# Patient Record
Sex: Female | Born: 1995
Health system: Southern US, Community
[De-identification: ages and names within clinical notes are randomized; demographics above are authoritative.]

## PROBLEM LIST (undated history)

## (undated) ENCOUNTER — Emergency Department (HOSPITAL_COMMUNITY): Admission: EM | Payer: Self-pay | Source: Home / Self Care

## (undated) DIAGNOSIS — H9325 Central auditory processing disorder: Secondary | ICD-10-CM

## (undated) DIAGNOSIS — R569 Unspecified convulsions: Secondary | ICD-10-CM

## (undated) DIAGNOSIS — F32A Depression, unspecified: Secondary | ICD-10-CM

## (undated) HISTORY — PX: TYMPANOSTOMY TUBE PLACEMENT: SHX32

---
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
## 2014-04-18 ENCOUNTER — Other Ambulatory Visit: Payer: Self-pay | Admitting: Family

## 2014-05-08 ENCOUNTER — Ambulatory Visit: Payer: Self-pay | Admitting: Physician Assistant

## 2014-05-09 ENCOUNTER — Other Ambulatory Visit: Payer: Self-pay | Admitting: Family

## 2014-06-05 ENCOUNTER — Other Ambulatory Visit: Payer: Self-pay | Admitting: Family

## 2014-06-13 ENCOUNTER — Ambulatory Visit: Payer: PRIVATE HEALTH INSURANCE | Admitting: Pediatrics

## 2014-06-20 ENCOUNTER — Other Ambulatory Visit: Payer: Self-pay | Admitting: Family

## 2014-06-27 ENCOUNTER — Ambulatory Visit: Payer: Self-pay | Admitting: Physician Assistant

## 2014-06-27 ENCOUNTER — Ambulatory Visit (INDEPENDENT_AMBULATORY_CARE_PROVIDER_SITE_OTHER): Payer: Commercial Managed Care - PPO | Admitting: Physician Assistant

## 2014-06-27 ENCOUNTER — Encounter: Payer: Self-pay | Admitting: Physician Assistant

## 2014-06-27 VITALS — BP 120/70 | HR 80 | Temp 98.1°F | Resp 16 | Ht 67.0 in | Wt 155.0 lb

## 2014-06-27 DIAGNOSIS — Z79899 Other long term (current) drug therapy: Secondary | ICD-10-CM

## 2014-06-27 DIAGNOSIS — Z23 Encounter for immunization: Secondary | ICD-10-CM

## 2014-06-27 DIAGNOSIS — R569 Unspecified convulsions: Secondary | ICD-10-CM

## 2014-06-27 DIAGNOSIS — R5383 Other fatigue: Secondary | ICD-10-CM

## 2014-06-27 DIAGNOSIS — Z8349 Family history of other endocrine, nutritional and metabolic diseases: Secondary | ICD-10-CM

## 2014-06-27 DIAGNOSIS — E559 Vitamin D deficiency, unspecified: Secondary | ICD-10-CM

## 2014-06-27 DIAGNOSIS — Z83438 Family history of other disorder of lipoprotein metabolism and other lipidemia: Secondary | ICD-10-CM

## 2014-06-27 LAB — CBC WITH DIFFERENTIAL/PLATELET
Basophils Absolute: 0 10*3/uL (ref 0.0–0.1)
Basophils Relative: 1 % (ref 0–1)
Eosinophils Absolute: 0 10*3/uL (ref 0.0–0.7)
Eosinophils Relative: 1 % (ref 0–5)
HEMATOCRIT: 37.7 % (ref 36.0–46.0)
HEMOGLOBIN: 13 g/dL (ref 12.0–15.0)
LYMPHS ABS: 1.6 10*3/uL (ref 0.7–4.0)
Lymphocytes Relative: 45 % (ref 12–46)
MCH: 29.5 pg (ref 26.0–34.0)
MCHC: 34.5 g/dL (ref 30.0–36.0)
MCV: 85.7 fL (ref 78.0–100.0)
MONOS PCT: 7 % (ref 3–12)
MPV: 9.5 fL (ref 8.6–12.4)
Monocytes Absolute: 0.2 10*3/uL (ref 0.1–1.0)
NEUTROS ABS: 1.6 10*3/uL — AB (ref 1.7–7.7)
NEUTROS PCT: 46 % (ref 43–77)
Platelets: 169 10*3/uL (ref 150–400)
RBC: 4.4 MIL/uL (ref 3.87–5.11)
RDW: 13.7 % (ref 11.5–15.5)
WBC: 3.5 10*3/uL — AB (ref 4.0–10.5)

## 2014-06-27 NOTE — Progress Notes (Signed)
Complete Physical  Assessment and Plan:  1. Seizures Continue follow up with neuro  2. Vitamin D deficiency - Vit D  25 hydroxy (rtn osteoporosis monitoring)  3. Medication management - CBC with Differential/Platelet - BASIC METABOLIC PANEL WITH GFR - Hepatic function panel - Magnesium  4. Other fatigue - TSH - Iron and TIBC - Ferritin - Vitamin B12  5. Family history of hyperlipidemia - Lipid panel  6. Need for vaccination - HPV 9-valent vaccine,Recombinat Need 2 month and 6 month  7. Atypical nevus Schedule removal  8. Health Maintenance- Discussed STD testing, safe sex, alcohol and drug awareness, drinking and driving dangers, wearing a seat belt and general safety measures for young adult.  Discussed med's effects and SE's. Screening labs and tests as requested with regular follow-up as recommended. Over 40 minutes of exam, counseling, chart review and critical decision making was performed  HPI  This very nice 19 y.o.female presents for complete physical.  Patient has no major health issues.  Patient reports no complaints at this time.  She does not workout. Her mother states she does not eat well, not enough veggies.  Finally, patient has history of Vitamin D Deficiency, need vitamin D checked.  She has history of seizures since she was 25 months old, follows with ped neuro, Dr. Sharene Skeans, she is now on depakote ER, last seizure 8 months ago.  She has some fatigue, does not want to go out and do things. She does not have a job at this time and when asked states " mom will help her find one" denies any interest. Does like dance, can not drive due to seizures, wants to when she is seizure free 1 year   Current Medications:  Current Outpatient Prescriptions on File Prior to Visit  Medication Sig Dispense Refill  . divalproex (DEPAKOTE ER) 500 MG 24 hr tablet TAKE 2 TABLETS BY MOUTH AT BEDTIME 60 tablet 0  . felbamate (FELBATOL) 600 MG tablet TAKE 2+1/2 TABLETS IN THE  MORNING AND 2+1/2 TABLETS AT BEDTIME 155 tablet 0  . midazolam (VERSED) 5 MG/ML injection Place 2 mLs (10 mg total) into the nose once. Draw up 1ml in 2 syringes, remove blue vial access device, then attach syringe to nasal atomizer for intranasal administration. Give 1ml in each nostril for seizures lasting 2 minutes or longer. 4 mL 0   No current facility-administered medications on file prior to visit.   Health Maintenance:   LMP: has nexplanon, has irreg perriods.  Sexually Active: no STD testing offered Pap: due 2016 MGM: N/A Last Dental Exam: recent to dentist  Last Eye Exam: unknown name, no glasses/contacts  Allergies:  Allergies  Allergen Reactions  . Dust Mite Extract    Medical History:  Past Medical History  Diagnosis Date  . Seizures    Surgical History: No past surgical history on file. Family History:  Family History  Problem Relation Age of Onset  . Lung cancer Maternal Grandfather     Died at 7  . Cancer Paternal Grandmother     Died at 69   Social History:  History  Substance Use Topics  . Smoking status: Passive Smoke Exposure - Never Smoker  . Smokeless tobacco: Never Used  . Alcohol Use: No    Review of Systems: Review of Systems  Constitutional: Positive for malaise/fatigue. Negative for fever, chills, weight loss and diaphoresis.  HENT: Negative.   Eyes: Negative.   Respiratory: Negative.   Cardiovascular: Negative.   Gastrointestinal: Negative.  Genitourinary: Negative.   Musculoskeletal: Negative.   Skin: Negative.   Neurological: Negative.  Negative for weakness.  Endo/Heme/Allergies: Negative.   Psychiatric/Behavioral: Negative.     Physical Exam: Estimated body mass index is 24.27 kg/(m^2) as calculated from the following:   Height as of this encounter: 5\' 7"  (1.702 m).   Weight as of this encounter: 155 lb (70.308 kg). BP 120/70 mmHg  Pulse 80  Temp(Src) 98.1 F (36.7 C)  Resp 16  Ht 5\' 7"  (1.702 m)  Wt 155 lb (70.308  kg)  BMI 24.27 kg/m2 General Appearance: Well nourished, in no apparent distress.  Eyes: PERRLA, EOMs, conjunctiva no swelling or erythema, normal fundi and vessels.  Sinuses: No Frontal/maxillary tenderness  ENT/Mouth: Ext aud canals clear, normal light reflex with TMs without erythema, bulging. Good dentition. No erythema, swelling, or exudate on post pharynx. Tonsils not swollen or erythematous. Hearing normal.  Neck: Supple, thyroid normal. No bruits  Respiratory: Respiratory effort normal, BS equal bilaterally without rales, rhonchi, wheezing or stridor.  Cardio: RRR without murmurs, rubs or gallops. Brisk peripheral pulses without edema.  Chest: symmetric, with normal excursions and percussion.  Breasts: Symmetric, without lumps, nipple discharge, retractions.  Abdomen: Soft, nontender, no guarding, rebound, hernias, masses, or organomegaly.  Lymphatics: Non tender without lymphadenopathy.  Genitourinary:  Musculoskeletal: Full ROM all peripheral extremities,5/5 strength, and normal gait.  Skin: Warm, dry without rashes, lesions, ecchymosis. Left lower 1x1 dark irreg border nevus, right upper back 3.5x4 irreg color nevus.  Neuro: Cranial nerves intact, reflexes equal bilaterally. Normal muscle tone, no cerebellar symptoms. Sensation intact.  Psych: Awake and oriented X 3, normal affect, Insight and Judgment appropriate.   EKG: defer  Quentin MullingCollier, Lashaun Krapf 10:58 AM [facility]

## 2014-06-27 NOTE — Patient Instructions (Signed)
Preventive Care for Adults A healthy lifestyle and preventive care can promote health and wellness. Preventive health guidelines for women include the following key practices.  A routine yearly physical is a good way to check with your health care provider about your health and preventive screening. It is a chance to share any concerns and updates on your health and to receive a thorough exam.  Visit your dentist for a routine exam and preventive care every 6 months. Brush your teeth twice a day and floss once a day. Good oral hygiene prevents tooth decay and gum disease.  The frequency of eye exams is based on your age, health, family medical history, use of contact lenses, and other factors. Follow your health care provider's recommendations for frequency of eye exams.  Eat a healthy diet. Foods like vegetables, fruits, whole grains, low-fat dairy products, and lean protein foods contain the nutrients you need without too many calories. Decrease your intake of foods high in solid fats, added sugars, and salt. Eat the right amount of calories for you.Get information about a proper diet from your health care provider, if necessary.  Regular physical exercise is one of the most important things you can do for your health. Most adults should get at least 150 minutes of moderate-intensity exercise (any activity that increases your heart rate and causes you to sweat) each week. In addition, most adults need muscle-strengthening exercises on 2 or more days a week.  Maintain a healthy weight. The body mass index (BMI) is a screening tool to identify possible weight problems. It provides an estimate of body fat based on height and weight. Your health care provider can find your BMI and can help you achieve or maintain a healthy weight.For adults 20 years and older:  A BMI below 18.5 is considered underweight.  A BMI of 18.5 to 24.9 is normal.  A BMI of 25 to 29.9 is considered overweight.  A BMI of  30 and above is considered obese.  Maintain normal blood lipids and cholesterol levels by exercising and minimizing your intake of saturated fat. Eat a balanced diet with plenty of fruit and vegetables. Blood tests for lipids and cholesterol should begin at age 76 and be repeated every 5 years. If your lipid or cholesterol levels are high, you are over 50, or you are at high risk for heart disease, you may need your cholesterol levels checked more frequently.Ongoing high lipid and cholesterol levels should be treated with medicines if diet and exercise are not working.  If you smoke, find out from your health care provider how to quit. If you do not use tobacco, do not start.  Lung cancer screening is recommended for adults aged 22-80 years who are at high risk for developing lung cancer because of a history of smoking. A yearly low-dose CT scan of the lungs is recommended for people who have at least a 30-pack-year history of smoking and are a current smoker or have quit within the past 15 years. A pack year of smoking is smoking an average of 1 pack of cigarettes a day for 1 year (for example: 1 pack a day for 30 years or 2 packs a day for 15 years). Yearly screening should continue until the smoker has stopped smoking for at least 15 years. Yearly screening should be stopped for people who develop a health problem that would prevent them from having lung cancer treatment.  If you are pregnant, do not drink alcohol. If you are breastfeeding,  be very cautious about drinking alcohol. If you are not pregnant and choose to drink alcohol, do not have more than 1 drink per day. One drink is considered to be 12 ounces (355 mL) of beer, 5 ounces (148 mL) of wine, or 1.5 ounces (44 mL) of liquor.  Avoid use of street drugs. Do not share needles with anyone. Ask for help if you need support or instructions about stopping the use of drugs.  High blood pressure causes heart disease and increases the risk of  stroke. Your blood pressure should be checked at least every 1 to 2 years. Ongoing high blood pressure should be treated with medicines if weight loss and exercise do not work.  If you are 75-52 years old, ask your health care provider if you should take aspirin to prevent strokes.  Diabetes screening involves taking a blood sample to check your fasting blood sugar level. This should be done once every 3 years, after age 15, if you are within normal weight and without risk factors for diabetes. Testing should be considered at a younger age or be carried out more frequently if you are overweight and have at least 1 risk factor for diabetes.  Breast cancer screening is essential preventive care for women. You should practice "breast self-awareness." This means understanding the normal appearance and feel of your breasts and may include breast self-examination. Any changes detected, no matter how small, should be reported to a health care provider. Women in their 58s and 30s should have a clinical breast exam (CBE) by a health care provider as part of a regular health exam every 1 to 3 years. After age 16, women should have a CBE every year. Starting at age 53, women should consider having a mammogram (breast X-ray test) every year. Women who have a family history of breast cancer should talk to their health care provider about genetic screening. Women at a high risk of breast cancer should talk to their health care providers about having an MRI and a mammogram every year.  Breast cancer gene (BRCA)-related cancer risk assessment is recommended for women who have family members with BRCA-related cancers. BRCA-related cancers include breast, ovarian, tubal, and peritoneal cancers. Having family members with these cancers may be associated with an increased risk for harmful changes (mutations) in the breast cancer genes BRCA1 and BRCA2. Results of the assessment will determine the need for genetic counseling and  BRCA1 and BRCA2 testing.  Routine pelvic exams to screen for cancer are no longer recommended for nonpregnant women who are considered low risk for cancer of the pelvic organs (ovaries, uterus, and vagina) and who do not have symptoms. Ask your health care provider if a screening pelvic exam is right for you.  If you have had past treatment for cervical cancer or a condition that could lead to cancer, you need Pap tests and screening for cancer for at least 20 years after your treatment. If Pap tests have been discontinued, your risk factors (such as having a new sexual partner) need to be reassessed to determine if screening should be resumed. Some women have medical problems that increase the chance of getting cervical cancer. In these cases, your health care provider may recommend more frequent screening and Pap tests.  The HPV test is an additional test that may be used for cervical cancer screening. The HPV test looks for the virus that can cause the cell changes on the cervix. The cells collected during the Pap test can be  tested for HPV. The HPV test could be used to screen women aged 30 years and older, and should be used in women of any age who have unclear Pap test results. After the age of 30, women should have HPV testing at the same frequency as a Pap test.  Colorectal cancer can be detected and often prevented. Most routine colorectal cancer screening begins at the age of 50 years and continues through age 75 years. However, your health care provider may recommend screening at an earlier age if you have risk factors for colon cancer. On a yearly basis, your health care provider may provide home test kits to check for hidden blood in the stool. Use of a small camera at the end of a tube, to directly examine the colon (sigmoidoscopy or colonoscopy), can detect the earliest forms of colorectal cancer. Talk to your health care provider about this at age 50, when routine screening begins. Direct  exam of the colon should be repeated every 5-10 years through age 75 years, unless early forms of pre-cancerous polyps or small growths are found.  People who are at an increased risk for hepatitis B should be screened for this virus. You are considered at high risk for hepatitis B if:  You were born in a country where hepatitis B occurs often. Talk with your health care provider about which countries are considered high risk.  Your parents were born in a high-risk country and you have not received a shot to protect against hepatitis B (hepatitis B vaccine).  You have HIV or AIDS.  You use needles to inject street drugs.  You live with, or have sex with, someone who has hepatitis B.  You get hemodialysis treatment.  You take certain medicines for conditions like cancer, organ transplantation, and autoimmune conditions.  Hepatitis C blood testing is recommended for all people born from 1945 through 1965 and any individual with known risks for hepatitis C.  Practice safe sex. Use condoms and avoid high-risk sexual practices to reduce the spread of sexually transmitted infections (STIs). STIs include gonorrhea, chlamydia, syphilis, trichomonas, herpes, HPV, and human immunodeficiency virus (HIV). Herpes, HIV, and HPV are viral illnesses that have no cure. They can result in disability, cancer, and death.  You should be screened for sexually transmitted illnesses (STIs) including gonorrhea and chlamydia if:  You are sexually active and are younger than 24 years.  You are older than 24 years and your health care provider tells you that you are at risk for this type of infection.  Your sexual activity has changed since you were last screened and you are at an increased risk for chlamydia or gonorrhea. Ask your health care provider if you are at risk.  If you are at risk of being infected with HIV, it is recommended that you take a prescription medicine daily to prevent HIV infection. This is  called preexposure prophylaxis (PrEP). You are considered at risk if:  You are a heterosexual woman, are sexually active, and are at increased risk for HIV infection.  You take drugs by injection.  You are sexually active with a partner who has HIV.  Talk with your health care provider about whether you are at high risk of being infected with HIV. If you choose to begin PrEP, you should first be tested for HIV. You should then be tested every 3 months for as long as you are taking PrEP.  Osteoporosis is a disease in which the bones lose minerals and strength   with aging. This can result in serious bone fractures or breaks. The risk of osteoporosis can be identified using a bone density scan. Women ages 65 years and over and women at risk for fractures or osteoporosis should discuss screening with their health care providers. Ask your health care provider whether you should take a calcium supplement or vitamin D to reduce the rate of osteoporosis.  Menopause can be associated with physical symptoms and risks. Hormone replacement therapy is available to decrease symptoms and risks. You should talk to your health care provider about whether hormone replacement therapy is right for you.  Use sunscreen. Apply sunscreen liberally and repeatedly throughout the day. You should seek shade when your shadow is shorter than you. Protect yourself by wearing long sleeves, pants, a wide-brimmed hat, and sunglasses year round, whenever you are outdoors.  Once a month, do a whole body skin exam, using a mirror to look at the skin on your back. Tell your health care provider of new moles, moles that have irregular borders, moles that are larger than a pencil eraser, or moles that have changed in shape or color.  Stay current with required vaccines (immunizations).  Influenza vaccine. All adults should be immunized every year.  Tetanus, diphtheria, and acellular pertussis (Td, Tdap) vaccine. Pregnant women should  receive 1 dose of Tdap vaccine during each pregnancy. The dose should be obtained regardless of the length of time since the last dose. Immunization is preferred during the 27th-36th week of gestation. An adult who has not previously received Tdap or who does not know her vaccine status should receive 1 dose of Tdap. This initial dose should be followed by tetanus and diphtheria toxoids (Td) booster doses every 10 years. Adults with an unknown or incomplete history of completing a 3-dose immunization series with Td-containing vaccines should begin or complete a primary immunization series including a Tdap dose. Adults should receive a Td booster every 10 years.  Varicella vaccine. An adult without evidence of immunity to varicella should receive 2 doses or a second dose if she has previously received 1 dose. Pregnant females who do not have evidence of immunity should receive the first dose after pregnancy. This first dose should be obtained before leaving the health care facility. The second dose should be obtained 4-8 weeks after the first dose.  Human papillomavirus (HPV) vaccine. Females aged 13-26 years who have not received the vaccine previously should obtain the 3-dose series. The vaccine is not recommended for use in pregnant females. However, pregnancy testing is not needed before receiving a dose. If a female is found to be pregnant after receiving a dose, no treatment is needed. In that case, the remaining doses should be delayed until after the pregnancy. Immunization is recommended for any person with an immunocompromised condition through the age of 26 years if she did not get any or all doses earlier. During the 3-dose series, the second dose should be obtained 4-8 weeks after the first dose. The third dose should be obtained 24 weeks after the first dose and 16 weeks after the second dose.  Zoster vaccine. One dose is recommended for adults aged 60 years or older unless certain conditions are  present.  Measles, mumps, and rubella (MMR) vaccine. Adults born before 1957 generally are considered immune to measles and mumps. Adults born in 1957 or later should have 1 or more doses of MMR vaccine unless there is a contraindication to the vaccine or there is laboratory evidence of immunity to   each of the three diseases. A routine second dose of MMR vaccine should be obtained at least 28 days after the first dose for students attending postsecondary schools, health care workers, or international travelers. People who received inactivated measles vaccine or an unknown type of measles vaccine during 1963-1967 should receive 2 doses of MMR vaccine. People who received inactivated mumps vaccine or an unknown type of mumps vaccine before 1979 and are at high risk for mumps infection should consider immunization with 2 doses of MMR vaccine. For females of childbearing age, rubella immunity should be determined. If there is no evidence of immunity, females who are not pregnant should be vaccinated. If there is no evidence of immunity, females who are pregnant should delay immunization until after pregnancy. Unvaccinated health care workers born before 1957 who lack laboratory evidence of measles, mumps, or rubella immunity or laboratory confirmation of disease should consider measles and mumps immunization with 2 doses of MMR vaccine or rubella immunization with 1 dose of MMR vaccine.  Pneumococcal 13-valent conjugate (PCV13) vaccine. When indicated, a person who is uncertain of her immunization history and has no record of immunization should receive the PCV13 vaccine. An adult aged 19 years or older who has certain medical conditions and has not been previously immunized should receive 1 dose of PCV13 vaccine. This PCV13 should be followed with a dose of pneumococcal polysaccharide (PPSV23) vaccine. The PPSV23 vaccine dose should be obtained at least 8 weeks after the dose of PCV13 vaccine. An adult aged 19  years or older who has certain medical conditions and previously received 1 or more doses of PPSV23 vaccine should receive 1 dose of PCV13. The PCV13 vaccine dose should be obtained 1 or more years after the last PPSV23 vaccine dose.  Pneumococcal polysaccharide (PPSV23) vaccine. When PCV13 is also indicated, PCV13 should be obtained first. All adults aged 65 years and older should be immunized. An adult younger than age 65 years who has certain medical conditions should be immunized. Any person who resides in a nursing home or long-term care facility should be immunized. An adult smoker should be immunized. People with an immunocompromised condition and certain other conditions should receive both PCV13 and PPSV23 vaccines. People with human immunodeficiency virus (HIV) infection should be immunized as soon as possible after diagnosis. Immunization during chemotherapy or radiation therapy should be avoided. Routine use of PPSV23 vaccine is not recommended for American Indians, [place] Natives, or people younger than 65 years unless there are medical conditions that require PPSV23 vaccine. When indicated, people who have unknown immunization and have no record of immunization should receive PPSV23 vaccine. One-time revaccination 5 years after the first dose of PPSV23 is recommended for people aged 19-64 years who have chronic kidney failure, nephrotic syndrome, asplenia, or immunocompromised conditions. People who received 1-2 doses of PPSV23 before age 65 years should receive another dose of PPSV23 vaccine at age 65 years or later if at least 5 years have passed since the previous dose. Doses of PPSV23 are not needed for people immunized with PPSV23 at or after age 65 years.  Meningococcal vaccine. Adults with asplenia or persistent complement component deficiencies should receive 2 doses of quadrivalent meningococcal conjugate (MenACWY-D) vaccine. The doses should be obtained at least 2 months apart.  Microbiologists working with certain meningococcal bacteria, military recruits, people at risk during an outbreak, and people who travel to or live in countries with a high rate of meningitis should be immunized. A first-year college student up through age   21 years who is living in a residence hall should receive a dose if she did not receive a dose on or after her 16th birthday. Adults who have certain high-risk conditions should receive one or more doses of vaccine.  Hepatitis A vaccine. Adults who wish to be protected from this disease, have certain high-risk conditions, work with hepatitis A-infected animals, work in hepatitis A research labs, or travel to or work in countries with a high rate of hepatitis A should be immunized. Adults who were previously unvaccinated and who anticipate close contact with an international adoptee during the first 60 days after arrival in the [place] from a country with a high rate of hepatitis A should be immunized.  Hepatitis B vaccine. Adults who wish to be protected from this disease, have certain high-risk conditions, may be exposed to blood or other infectious body fluids, are household contacts or sex partners of hepatitis B positive people, are clients or workers in certain care facilities, or travel to or work in countries with a high rate of hepatitis B should be immunized.  Haemophilus influenzae type b (Hib) vaccine. A previously unvaccinated person with asplenia or sickle cell disease or having a scheduled splenectomy should receive 1 dose of Hib vaccine. Regardless of previous immunization, a recipient of a hematopoietic stem cell transplant should receive a 3-dose series 6-12 months after her successful transplant. Hib vaccine is not recommended for adults with HIV infection. Preventive Services / Frequency  Ages 19 to 39 years 1. Blood pressure check. 2. Lipid and cholesterol check. 3. Clinical breast exam.** / Every 3 years for women in their  20s and 30s. 4. BRCA-related cancer risk assessment.** / For women who have family members with a BRCA-related cancer (breast, ovarian, tubal, or peritoneal cancers). 5. Pap test.** / Every 2 years from ages 21 through 29. Every 3 years starting at age 30 through age 65 or 70 with a history of 3 consecutive normal Pap tests. 6. HPV screening.** / Every 3 years from ages 30 through ages 65 to 70 with a history of 3 consecutive normal Pap tests. 7. Hepatitis C blood test.** / For any individual with known risks for hepatitis C. 8. Skin self-exam. / Monthly. 9. Influenza vaccine. / Every year. 10. Tetanus, diphtheria, and acellular pertussis (Tdap, Td) vaccine.** / Consult your health care provider. Pregnant women should receive 1 dose of Tdap vaccine during each pregnancy. 1 dose of Td every 10 years. 11. Varicella vaccine.** / Consult your health care provider. Pregnant females who do not have evidence of immunity should receive the first dose after pregnancy. 12. HPV vaccine. / 3 doses over 6 months, if 26 and younger. The vaccine is not recommended for use in pregnant females. However, pregnancy testing is not needed before receiving a dose. 13. Measles, mumps, rubella (MMR) vaccine.** / You need at least 1 dose of MMR if you were born in 1957 or later. You may also need a 2nd dose. For females of childbearing age, rubella immunity should be determined. If there is no evidence of immunity, females who are not pregnant should be vaccinated. If there is no evidence of immunity, females who are pregnant should delay immunization until after pregnancy. 14. Pneumococcal 13-valent conjugate (PCV13) vaccine.** / Consult your health care provider. 15. Pneumococcal polysaccharide (PPSV23) vaccine.** / 1 to 2 doses if you smoke cigarettes or if you have certain conditions. 16. Meningococcal vaccine.** / 1 dose if you are age 19 to 21 years and a   first-year college student living in a residence hall, or have one  of several medical conditions, you need to get vaccinated against meningococcal disease. You may also need additional booster doses. 17. Hepatitis A vaccine.** / Consult your health care provider. 18. Hepatitis B vaccine.** / Consult your health care provider. 19. Haemophilus influenzae type b (Hib) vaccine.** / Consult your health care provider.  24. Chlamydia, HIV, and other sexually transmitted diseases- Get screened every year until age 54, then within three months of each new sexual provider. 21. Pap Smear- Every 1-3 years; discuss with your health care provider. 28. Mammogram- Every year starting at age 40  Take these steps 1. Do not smoke-Your healthcare provider can help you quit.  For tips on how to quit go to www.smokefree.gov or call 1-800 QUITNOW. 2. Be physically active- Exercise 5 days a week for at least 30 minutes.  If you are not already physically active, start slow and gradually work up to 30 minutes of moderate physical activity.  Examples of moderate activity include walking briskly, dancing, swimming, bicycling, etc. 3. Breast Cancer- A self breast exam every month is important for early detection of breast cancer.  For more information and instruction on self breast exams, ask your healthcare provider or https://www.patel.info/. 4. Eat a healthy diet- Eat a variety of healthy foods such as fruits, vegetables, whole grains, low fat milk, low fat cheeses, yogurt, lean meats, poultry and fish, beans, nuts, tofu, etc.  For more information go to www. Thenutritionsource.org 5. Drink alcohol in moderation- Limit alcohol intake to one drink or less per day. Never drink and drive. 6. Depression- Your emotional health is as important as your physical health.  If you're feeling down or losing interest in things you normally enjoy please talk to your healthcare provider about being screened for depression. 7. Dental visit- Brush and floss your teeth twice daily;  visit your dentist twice a year. 8. Eye doctor- Get an eye exam at least every 2 years. 9. Helmet use- Always wear a helmet when riding a bicycle, motorcycle, rollerblading or skateboarding. 6. Safe sex- If you may be exposed to sexually transmitted infections, use a condom. 11. Seat belts- Seat belts can save your live; always wear one. 12. Smoke/Carbon Monoxide detectors- These detectors need to be installed on the appropriate level of your home. Replace batteries at least once a year. 13. Skin cancer- When out in the sun please cover up and use sunscreen 15 SPF or higher. 14. Violence- If anyone is threatening or hurting you, please tell your healthcare provider.

## 2014-06-28 ENCOUNTER — Encounter: Payer: Self-pay | Admitting: Pediatrics

## 2014-06-28 ENCOUNTER — Ambulatory Visit (INDEPENDENT_AMBULATORY_CARE_PROVIDER_SITE_OTHER): Payer: Commercial Managed Care - PPO | Admitting: Pediatrics

## 2014-06-28 VITALS — BP 120/70 | HR 84 | Ht 66.5 in | Wt 155.0 lb

## 2014-06-28 DIAGNOSIS — G40209 Localization-related (focal) (partial) symptomatic epilepsy and epileptic syndromes with complex partial seizures, not intractable, without status epilepticus: Secondary | ICD-10-CM

## 2014-06-28 DIAGNOSIS — F70 Mild intellectual disabilities: Secondary | ICD-10-CM | POA: Diagnosis not present

## 2014-06-28 DIAGNOSIS — G253 Myoclonus: Secondary | ICD-10-CM

## 2014-06-28 DIAGNOSIS — G40309 Generalized idiopathic epilepsy and epileptic syndromes, not intractable, without status epilepticus: Secondary | ICD-10-CM | POA: Diagnosis not present

## 2014-06-28 LAB — HEPATIC FUNCTION PANEL
ALBUMIN: 4.5 g/dL (ref 3.5–5.2)
ALK PHOS: 90 U/L (ref 39–117)
ALT: 9 U/L (ref 0–35)
AST: 15 U/L (ref 0–37)
BILIRUBIN TOTAL: 0.2 mg/dL (ref 0.2–1.1)
Bilirubin, Direct: <0.1 mg/dL (ref 0.0–0.3)
Total Protein: 7.2 g/dL (ref 6.0–8.3)

## 2014-06-28 LAB — BASIC METABOLIC PANEL WITH GFR
BUN: 7 mg/dL (ref 6–23)
CHLORIDE: 104 meq/L (ref 96–112)
CO2: 24 meq/L (ref 19–32)
CREATININE: 0.46 mg/dL — AB (ref 0.50–1.10)
Calcium: 9.6 mg/dL (ref 8.4–10.5)
GFR, Est African American: >89 mL/min
GFR, Est Non African American: >89 mL/min
Glucose, Bld: 90 mg/dL (ref 70–99)
Potassium: 3.8 mEq/L (ref 3.5–5.3)
Sodium: 141 mEq/L (ref 135–145)

## 2014-06-28 LAB — LIPID PANEL
CHOL/HDL RATIO: 2.6 ratio
CHOLESTEROL: 159 mg/dL (ref 0–200)
HDL: 61 mg/dL (ref 36–76)
LDL Cholesterol: 75 mg/dL (ref 0–99)
TRIGLYCERIDES: 117 mg/dL (ref <150)
VLDL: 23 mg/dL (ref 0–40)

## 2014-06-28 LAB — TSH: TSH: 2.973 u[IU]/mL (ref 0.350–4.500)

## 2014-06-28 LAB — IRON AND TIBC
%SAT: 21 % (ref 20–55)
Iron: 88 ug/dL (ref 42–145)
TIBC: 412 ug/dL (ref 250–470)
UIBC: 324 ug/dL (ref 125–400)

## 2014-06-28 LAB — VITAMIN D 25 HYDROXY (VIT D DEFICIENCY, FRACTURES): Vit D, 25-Hydroxy: 32 ng/mL (ref 30–100)

## 2014-06-28 LAB — VITAMIN B12: Vitamin B-12: 410 pg/mL (ref 211–911)

## 2014-06-28 LAB — FERRITIN: Ferritin: 9 ng/mL — ABNORMAL LOW (ref 10–291)

## 2014-06-28 LAB — MAGNESIUM: MAGNESIUM: 1.6 mg/dL (ref 1.5–2.5)

## 2014-06-28 MED ORDER — DIVALPROEX SODIUM ER 500 MG PO TB24: ORAL_TABLET | ORAL | Status: DC

## 2014-06-28 MED ORDER — FELBAMATE 600 MG PO TABS: ORAL_TABLET | ORAL | Status: DC

## 2014-06-28 NOTE — Progress Notes (Signed)
Patient: Rebecca Chandler MRN: [id number] Sex: female DOB: 14-Feb-1996  Provider: Deetta Perla, MD Location of Care: [facility]  Note type: Routine return visit  History of Present Illness: Referral Source: Dr. Lucky Cowboy History from: mother, patient and [facility] chart Chief Complaint: Seizures  Rebecca Chandler is a 19 y.o. female who returns on June 28, 2014 for the first time since November 09, 2013.  She has a history of seizures that began as an infant and involved generalized tonic-clonic, localization related, and myoclonic seizures.  Her history is documented in past medical history.  She was seizure-free between age [age] and [age] off medication.  Since that time, she has experienced intractable seizures.  For reasons that are unclear to me, she has been seizure-free since she was seen in September.  She had myoclonus which stopped when Lamictal was discontinued and Depakote was started.  Depakote has caused a significant weight gain of 23 pounds which is problematic, but it has controlled her seizures.  Her last known seizure was in mid-August.  At that time, she was experiencing one to two seizures per month which appeared to be localization related.  It is unclear that the last time she had a generalized tonic-clonic seizure.  Darcie stays at home with her mother.  She has chores to do, but has intellectual disability and also is quite immature.  Her mother is not willing to bring her out onto the workforce until she feels that she can handle it.  I suspect that will never take place.  Since her last visit, her health has been good.  She is sleeping well.  Her appetite has unfortunately increased.  Review of Systems: 12 system review was unremarkable, normal sleep, appetite, no intercurrent infections, no new neurological conditions  Past Medical History Diagnosis Date  . Seizures    Hospitalizations: No., Head Injury: No., Nervous  System Infections: No., Immunizations up to date: Yes.    Rebecca Chandler had onset of seizures when she was [age] months of age. Seizures were myoclonic associated with widening of her eyes. Mother remembers that I used Depakote initially.  At some point, she was switched to [facility]. She was able to come off antiepileptic medications and was seizure free from age 67 to 38.  At that time, she had nocturnal arousals with staring, grunting, and sucking sounds with opening and closing her eyelids lasting for five minutes. Her head turned toward the right, these increased from once a month to once a week.  The patient was sent to [facility] for the first of many EMU evaluations. No events were recorded. She was placed on Tegretol and Keppra. Tegretol was discontinued and Felbatol restarted. In January 2003, she had a 6-day EMU evaluation that showed rare interictal right hemispheric sharp waves, she remained on Keppra and Felbatol.  In December 2009, she had simple and complex partial seizures monthly, typically associated with left-sided jerking and left Todd's paresis, these lasted five to eight minutes and tended to occur between midnight and 5 a.m. She was on Felbatol and Lamictal.   On August 12, 2008, the patient had an EEG that showed frequent high amplitude spike discharges bifrontally that occurred in runs of three to four, this was more prominent over the right than the left hemisphere particularly at Fp2 and F4. She had one ictal event that happened after her medications had been withdrawn. She extended her left leg upward and outward, her head deviated tonically to the right, she  vocalized and had tonic stiffening of her entire body, she was cyanotic, she had clonic activity right greater than left. This started in the left parasagittal region, migrated to the left temple followed by generalized myogenic artifact. This was followed by a rhythmic 2 Hz spike and wave abnormality prominent over the right  hemisphere and declined in frequency.  Ictal SPECT showed uptake in the left frontal region. This was believed to be not typical of her seizures. She was sent home on Lamictal and Trileptal, but was unable to tolerate Trileptal and was placed on Felbatol.   The record showed some confusion, but the patient was on the combination of Lamictal and Felbatol when I saw her next on September 20, 2011. She lost a tremendous amount of weight, but unfortunately began to experience myoclonus. Myoclonus was fairly frequent, but generalized seizures were relatively infrequent.  Birth History Term infant to a 13 year old primigravida female  Normal spontaneous vaginal delivery  Nursery course was unremarkable.  Development was normal except for problems with articulation. I believe that she has intellectual disability in the form of significant learning differences.  Behavior History none  Surgical History History reviewed. No pertinent past surgical history.  Family History family history includes Cancer in her paternal grandmother; Lung cancer in her maternal grandfather. Adrenoleukodystrophy in 3 maternal first cousins Family history is negative for migraines, seizures, intellectual disabilities, blindness, deafness, birth defects, chromosomal disorder, or autism.  Social History . Marital Status: Single    Spouse Name: N/A  . Number of Children: N/A  . Years of Education: N/A   Social History Main Topics  . Smoking status: Passive Smoke Exposure - Never Smoker  . Smokeless tobacco: Never Used     Comment: Step father smokes   . Alcohol Use: No  . Drug Use: No  . Sexual Activity: Not Currently    Birth Control/ Protection: None   Social History Narrative   Educational level 12th grade School  Occupation: none; Living with mother and step father   Hobbies/Interest: Enjoys singing and Just Dance on her Clinical research associate system.  School comments Rebecca Chandler graduated from [employer] June of 2015.   Allergies Allergen Reactions  . Dust Mite Extract    Physical Exam BP 120/70 mmHg  Pulse 84  Ht 5' 6.5" (1.689 m)  Wt 155 lb (70.308 kg)  BMI 24.65 kg/m2  LMP 06/10/2014 (Approximate)  General: alert, well developed, well nourished, in no acute distress, brown hair, hazel eyes, right handed Head: normocephalic, no dysmorphic features Ears, Nose and Throat: Otoscopic: tympanic membranes normal; pharynx: oropharynx is pink without exudates or tonsillar hypertrophy Neck: supple, full range of motion, no cranial or cervical bruits Respiratory: auscultation clear Cardiovascular: no murmurs, pulses are normal Musculoskeletal: no skeletal deformities or apparent scoliosis Skin: no rashes or neurocutaneous lesions  Neurologic Exam  Mental Status: alert; oriented to person, place and year; knowledge is below normal for age; language is normal Cranial Nerves: visual fields are full to double simultaneous stimuli; extraocular movements are full and conjugate; pupils are round reactive to light; funduscopic examination shows sharp disc margins with normal vessels; symmetric facial strength; midline tongue and uvula; air conduction is greater than bone conduction bilaterally Motor: Normal strength, tone and mass; good fine motor movements; no pronator drift Sensory: intact responses to cold, vibration, proprioception and stereognosis Coordination: good finger-to-nose, rapid repetitive alternating movements and finger apposition Gait and Station: normal gait and station: patient is able to walk on heels, toes and  tandem without difficulty; balance is adequate; Romberg exam is negative; Gower response is negative Reflexes: symmetric and diminished bilaterally; no clonus; bilateral flexor plantar responses  Assessment 1. Partial epilepsy with impairment of consciousness not intractable, G40.209. 2. Generalized convulsive epilepsy, G40.309. 3. Myoclonus, G25.3. 4. Mild  intellectual disability, F70.  Discussion I am extremely pleased and somewhat surprised that Rebecca Chandler's seizures are under complete control.  I do not know how long that will last.  She is getting adequate sleep, taking medication is ordered.  Her mother fought for disability, but it was clear that to better controlled her seizures a less likely that was.  I am concerned about her weight gain which I believe can be attributed to the effects of divalproex.  She also has a mild essential tremor related to divalproex.  Plan Continue her current medications without change.  Prescriptions were refilled for felbamate and divalproex.  She will return to see me in six months' time.  I spent 30 minutes of face-to-face time with Rebecca Chandler and her mother, more than half of it in consultation.   Medication List   This list is accurate as of: 06/28/14  2:08 PM.       divalproex 500 MG 24 hr tablet  Commonly known as:  DEPAKOTE ER  Take 2 tablets at bedtime     felbamate 600 MG tablet  Commonly known as:  FELBATOL  TAKE 2+1/2 TABLETS IN THE MORNING AND 2+1/2 TABLETS AT BEDTIME     midazolam 5 MG/ML injection  Commonly known as:  VERSED  Place 2 mLs (10 mg total) into the nose once. Draw up 1ml in 2 syringes, remove blue vial access device, then attach syringe to nasal atomizer for intranasal administration. Give 1ml in each nostril for seizures lasting 2 minutes or longer.      The medication list was reviewed and reconciled. All changes or newly prescribed medications were explained.  A complete medication list was provided to the patient/caregiver.  Deetta PerlaWilliam H Hickling MD

## 2014-06-28 NOTE — Patient Instructions (Signed)
I'm so happy that Rebecca Chandler is seizure-free.  Pay attention to the pills when you get new prescriptions to make certain that maker has not changed.  Asked the pharmacist to makes the pills both for the felbamate and divalproex.  You probably can influence that decision.  Call me if that takes place so that we can check a drug level.

## 2014-06-29 ENCOUNTER — Other Ambulatory Visit: Payer: Self-pay | Admitting: Family

## 2014-09-13 ENCOUNTER — Telehealth: Payer: Self-pay | Admitting: Family

## 2014-09-13 NOTE — Telephone Encounter (Signed)
Mom Rebecca Chandler left message about [name]. She said that she had a "mild seizure" that lasted 7 minutes at 4:48 AM this morning. Mom said that was her first seizure in almost a year. Mom asked for Dr Sharene SkeansHickling to call her back at [phone]. TG

## 2014-09-14 ENCOUNTER — Other Ambulatory Visit: Payer: Self-pay | Admitting: Family

## 2014-09-18 ENCOUNTER — Encounter: Payer: Self-pay | Admitting: Pediatrics

## 2014-09-24 NOTE — Telephone Encounter (Signed)
Mom [name] called and said that she knew Dr Sharene SkeansHickling had tried to call her but that he had unfortunately been dialing wrong number. She said that she wanted to be sure that Dr Sharene SkeansHickling was calling her at [phone]. That is her new phone number and has been corrected in Epic. TG

## 2014-09-25 NOTE — Telephone Encounter (Signed)
I left a message for mother to call.  She identified herself on voicemail.

## 2014-09-27 ENCOUNTER — Encounter: Payer: Self-pay | Admitting: Physician Assistant

## 2014-10-03 NOTE — Telephone Encounter (Signed)
I finally Reached mother.  I decided not to make any changes because their have been virtually no seizures in a year.  She is been compliant with her medication.  If she has another seizure anytime soon we will check drug levels and make changes.

## 2015-01-16 ENCOUNTER — Ambulatory Visit: Payer: Commercial Managed Care - PPO | Admitting: Pediatrics

## 2015-01-17 ENCOUNTER — Other Ambulatory Visit: Payer: Self-pay | Admitting: Family

## 2015-04-22 ENCOUNTER — Other Ambulatory Visit: Payer: Self-pay | Admitting: Family

## 2015-04-22 ENCOUNTER — Encounter: Payer: Self-pay | Admitting: Family

## 2015-05-15 ENCOUNTER — Encounter: Payer: Self-pay | Admitting: Pediatrics

## 2015-05-15 ENCOUNTER — Ambulatory Visit (INDEPENDENT_AMBULATORY_CARE_PROVIDER_SITE_OTHER): Payer: Commercial Managed Care - PPO | Admitting: Pediatrics

## 2015-05-15 VITALS — BP 104/70 | HR 100 | Ht 66.5 in | Wt 140.2 lb

## 2015-05-15 DIAGNOSIS — F70 Mild intellectual disabilities: Secondary | ICD-10-CM

## 2015-05-15 DIAGNOSIS — G40209 Localization-related (focal) (partial) symptomatic epilepsy and epileptic syndromes with complex partial seizures, not intractable, without status epilepticus: Secondary | ICD-10-CM

## 2015-05-15 DIAGNOSIS — G40309 Generalized idiopathic epilepsy and epileptic syndromes, not intractable, without status epilepticus: Secondary | ICD-10-CM | POA: Diagnosis not present

## 2015-05-15 MED ORDER — DIVALPROEX SODIUM ER 500 MG PO TB24: ORAL_TABLET | ORAL | Status: DC

## 2015-05-15 MED ORDER — FELBAMATE 600 MG PO TABS: ORAL_TABLET | ORAL | Status: DC

## 2015-05-15 NOTE — Progress Notes (Signed)
Patient: Rebecca Chandler MRN: [id number] Sex: female DOB: [birth year]  Provider: Deetta Perla, MD Location of Care: [facility]  Note type: Routine return visit  History of Present Illness: Referral Source: Dr. Lucky Chandler History from: mother, patient and [facility] chart Chief Complaint: Seizures  Rebecca Chandler is a 20 y.o. female who returns May 15, 2015 for the first time since June 28, 2014.  She has partial epilepsy with impairment of consciousness, generalized tonic-clonic, and myoclonic seizures.  Her seizure control has been quite good in the last 18 months.  She had a single seizure when she and her mother travelled to [place] November 25, 2014, this occurred in the middle of the night and was a generalized convulsive event.  She takes and tolerates her antiepileptic medications, divalproex and felbamate without side effects.  She has intellectual disability which has significantly affected her ability to learn and has implications for her long-term success with gainful employment.  Currently her only medical problem is the feeling of sinusitis with pressure in her sinuses and postnasal drip as well as mild headache.  She is an adult living at home.  Her major activities are cleaning the house.  Her mother is very demanding in this sense.  She wants Rebecca Chandler to clean correctly and brings to her attention the times when this does not take place.  Like other parents she is worried that Rebecca Chandler will enter adulthood without any usable skills and she is worried about her getting a job that can sustain her.  Stepfather works the night shift and is with Rebecca Chandler during the day, and also her stepbrother.  All three sleep during the day and are awake at night time.  Mother is at home at nighttime so that Rebecca Chandler is never without supervision.  Family history is remarkable for two first cousins from an identical twin maternal aunt who had adrenal  leukodystrophy, one of whom died and a second first cousin from another maternal aunt who died this year.  All of the boys had adrenal leukodystrophy.  We do not know if Rebecca Chandler is a carrier for the gene.  Both her mother and identical twin maternal aunt are carriers.  She has been treated with a number of antiepileptic medications including carbamazepine, Trileptal, levetiracetam, and Lamictal.  She has either not responded well to the other medications or had side effects such as weight gain and myoclonus when Lamictal was added.  This is the best seizure control that she has experienced since she was seizure-free for about two years between ages 59 and 69.  Review of Systems: 12 system review was assessed and except as noted above was otherwise negative  Past Medical History Diagnosis Date  . Seizures (HCC)    Hospitalizations: No., Head Injury: No., Nervous System Infections: No., Immunizations up to date: Yes.    Rebecca Chandler had onset of seizures when she was [age] months of age. Seizures were myoclonic associated with widening of her eyes. Mother remembers that I used Depakote initially. At some point, she was switched to [facility]. She was able to come off antiepileptic medications and was seizure free from age 53 to 26.  At that time, she had nocturnal arousals with staring, grunting, and sucking sounds with opening and closing her eyelids lasting for five minutes. Her head turned toward the right, these increased from once a month to once a week.  The patient was sent to [facility] for the first of many EMU evaluations. No events  were recorded. She was placed on Tegretol and Keppra. Tegretol was discontinued and Felbatol restarted. In January 2003, she had a 6-day EMU evaluation that showed rare interictal right hemispheric sharp waves, she remained on Keppra and Felbatol.  In December 2009, she had simple and complex partial seizures monthly, typically associated with left-sided jerking  and left Todd's paresis, these lasted five to eight minutes and tended to occur between midnight and 5 a.m. She was on Felbatol and Lamictal.   On August 12, 2008, the patient had an EEG that showed frequent high amplitude spike discharges bifrontally that occurred in runs of three to four, this was more prominent over the right than the left hemisphere particularly at Fp2 and F4. She had one ictal event that happened after her medications had been withdrawn. She extended her left leg upward and outward, her head deviated tonically to the right, she vocalized and had tonic stiffening of her entire body, she was cyanotic, she had clonic activity right greater than left. This started in the left parasagittal region, migrated to the left temple followed by generalized myogenic artifact. This was followed by a rhythmic 2 Hz spike and wave abnormality prominent over the right hemisphere and declined in frequency.  Ictal SPECT showed uptake in the left frontal region. This was believed to be not typical of her seizures. She was sent home on Lamictal and Trileptal, but was unable to tolerate Trileptal and was placed on Felbatol.   The record showed some confusion, but the patient was on the combination of Lamictal and Felbatol when I saw her next on September 20, 2011. She lost a tremendous amount of weight, but unfortunately began to experience myoclonus. Myoclonus was fairly frequent, but generalized seizures were relatively infrequent.  Birth History Term infant to a 20 year old primigravida female  Normal spontaneous vaginal delivery  Nursery course was unremarkable.  Development was normal except for problems with articulation. I believe that she has intellectual disability in the form of significant learning differences.  Behavior History none  Surgical History No past surgical history on file.  Family History family history includes Cancer in her paternal grandmother; Lung cancer in her maternal  grandfather. Family history is negative for migraines, seizures, intellectual disabilities, blindness, deafness, birth defects, chromosomal disorder, or autism.  Social History . Marital Status: Single    Spouse Name: N/A  . Number of Children: N/A  . Years of Education: N/A   Social History Main Topics  . Smoking status: Passive Smoke Exposure - Never Smoker  . Smokeless tobacco: Never Used     Comment: Step father smokes   . Alcohol Use: No  . Drug Use: No  . Sexual Activity: Not Currently    Birth Control/ Protection: None   Social History Narrative    [name] is a 20 yo woman who has graduated. She lives with her mother and stepfather. She has 2 brothers. She enjoys sleeping, listening to music and talking to friends.   Allergies Allergen Reactions  . Dust Mite Extract    Physical Exam BP 104/70 mmHg  Pulse 100  Ht 5' 6.5" (1.689 m)  Wt 140 lb 3.2 oz (63.594 kg)  BMI 22.29 kg/m2  LMP 04/28/2015 (Approximate)  General: alert, well developed, well nourished, in no acute distress, brown hair, hazel eyes, right handed Head: normocephalic, no dysmorphic features Ears, Nose and Throat: Otoscopic: tympanic membranes normal; pharynx: oropharynx is pink without exudates or tonsillar hypertrophy Neck: supple, full range of motion, no cranial or cervical  bruits Respiratory: auscultation clear Cardiovascular: no murmurs, pulses are normal Musculoskeletal: no skeletal deformities or apparent scoliosis Skin: no rashes or neurocutaneous lesions  Neurologic Exam  Mental Status: alert; oriented to person, place and year; knowledge is below normal for age; language is normal Cranial Nerves: visual fields are full to double simultaneous stimuli; extraocular movements are full and conjugate; pupils are round reactive to light; funduscopic examination shows sharp disc margins with normal vessels; symmetric facial strength; midline tongue and uvula; air conduction is greater than bone  conduction bilaterally Motor: Normal strength, tone and mass; good fine motor movements; no pronator drift Sensory: intact responses to cold, vibration, proprioception and stereognosis Coordination: good finger-to-nose, rapid repetitive alternating movements and finger apposition Gait and Station: normal gait and station: patient is able to walk on heels, toes and tandem without difficulty; balance is adequate; Romberg exam is negative; Gower response is negative Reflexes: symmetric and diminished bilaterally; no clonus; bilateral flexor plantar responses  Assessment 1. Partial epilepsy with impairment of consciousness, not intractable, G40.209. 2. Generalized convulsive epilepsy, G40.309. 3. Mild intellectual disability, F70.0.  Discussion I am pleased that Saryna is doing well as regard to her seizure control.  There is no reason to change her medication.  Prescriptions for divalproex and felbamate were refilled.  I am concerned like her mother that because of her intellectual disability and failure to obtain adequate academic skills during the time she was in school.  She is unprepared to work without supervision.  Much of our visit today surrounded trying to explore opportunities for a work situation where she has a job Psychologist, occupational who can Actor in the work setting so that she does not lose her job.  I found this to be a very challenging issue with other young adults for whom I care.  Plan She will return to see me in one year.  I will see her sooner based on clinical need.  I spent 30 minutes of face-to-face time with Anaston and her mother, more than half of it in consultation.   Medication List   This list is accurate as of: 05/15/15 11:35 AM.       divalproex 500 MG 24 hr tablet  Commonly known as:  DEPAKOTE ER  TAKE 2 TABLETS BY MOUTH AT BEDTIME     felbamate 600 MG tablet  Commonly known as:  FELBATOL  TAKE 2+1/2 TABLETS IN THE MORNING AND 2+1/2 TABLETS AT BEDTIME      midazolam 5 MG/ML injection  Commonly known as:  VERSED  Place 2 mLs (10 mg total) into the nose once. Draw up 1ml in 2 syringes, remove blue vial access device, then attach syringe to nasal atomizer for intranasal administration. Give 1ml in each nostril for seizures lasting 2 minutes or longer.      The medication list was reviewed and reconciled. All changes or newly prescribed medications were explained.  A complete medication list was provided to the patient/caregiver.  Rebecca Perla MD

## 2015-05-15 NOTE — Patient Instructions (Signed)
Sign up for My Chart.

## 2015-05-17 ENCOUNTER — Other Ambulatory Visit: Payer: Self-pay | Admitting: Internal Medicine

## 2015-05-24 ENCOUNTER — Other Ambulatory Visit: Payer: Self-pay | Admitting: Family

## 2015-11-20 ENCOUNTER — Other Ambulatory Visit: Payer: Self-pay | Admitting: Pediatrics

## 2015-11-20 DIAGNOSIS — G40309 Generalized idiopathic epilepsy and epileptic syndromes, not intractable, without status epilepticus: Secondary | ICD-10-CM

## 2015-11-20 DIAGNOSIS — G40209 Localization-related (focal) (partial) symptomatic epilepsy and epileptic syndromes with complex partial seizures, not intractable, without status epilepticus: Secondary | ICD-10-CM

## 2016-01-05 ENCOUNTER — Ambulatory Visit (INDEPENDENT_AMBULATORY_CARE_PROVIDER_SITE_OTHER): Payer: Commercial Managed Care - PPO | Admitting: Internal Medicine

## 2016-01-05 ENCOUNTER — Encounter: Payer: Self-pay | Admitting: Internal Medicine

## 2016-01-05 VITALS — BP 104/64 | HR 120 | Temp 98.1°F | Resp 16 | Ht 67.0 in | Wt 144.6 lb

## 2016-01-05 DIAGNOSIS — R509 Fever, unspecified: Secondary | ICD-10-CM | POA: Diagnosis not present

## 2016-01-05 MED ORDER — CEPHALEXIN 500 MG PO CAPS: 500.0000 mg | ORAL_CAPSULE | Freq: Four times a day (QID) | ORAL | 0 refills | Status: DC

## 2016-01-05 NOTE — Progress Notes (Signed)
[facility]   Lucky CowboyWilliam Jariana Shumard, M.D.    Dyanne CarrelAmanda R. Steffanie Dunnollier, P.A.-C      Terri Piedraourtney Forcucci, P.A.-C  [facility]                [address]                [place], [place] [id number] Telephone [phone] Telefax [phone] Subjective:    Patient ID: Rebecca Chandler, female    DOB: 1995/04/29, 20 y.o.   MRN: [id number]  HPI  This 20 y/o single WF with seizure disorder and mild intellectual impairment is brought in today by her father for evaluation for a 24 hr hx/o fever to 103 last night and was given Tylenol. Patient denies any recent unusual exposures or insect bites . Denies chills, rash and has not had any respiratory, GI, GU/UT sx's.   Medication Sig  . divalproex (DEPAKOTE ER) 500 MG 24 hr tablet TAKE 2 TABLETS BY MOUTH AT BEDTIME  . felbamate (FELBATOL) 600 MG tablet TAKE 2 AND 1/2 TABLETS IN THE MORNING AND 2 AND 1/2 TABLETS AT BEDTIME  . midazolam (VERSED) 5 MG/ML injection Place 2 mLs (10 mg total) into the nose once. Draw up 1ml in 2 syringes, remove blue vial access device, then attach syringe to nasal atomizer for intranasal administration. Give 1ml in each nostril for seizures lasting 2 minutes or longer.   Allergies  Allergen Reactions  . Dust Mite Extract    Past Medical History:  Diagnosis Date  . Seizures (HCC)    No past surgical history on file.  Review of Systems In addition to the HPI above,  No -chills,  No Headache, No changes with Vision or hearing,  No problems swallowing food or Liquids,  No Chest pain or productive Cough or Shortness of Breath,  No Abdominal pain, No Nausea or Vomitting, Bowel movements are regular,  No Blood in stool or Urine,  No dysuria,  No new skin rashes or bruises,  No new joints pains-aches,  No new weakness, tingling, numbness in any extremity,  No recent weight loss,  No polyuria, polydypsia or polyphagia,  No significant Mental Stressors.  A  full 10 point Review of Systems was done, except as stated above, all other Review of Systems were negative    Objective:   Physical Exam  BP 104/64   Pulse (!) 120   Temp 98.1 F (36.7 C)   Resp 16   Ht 5\' 7"  (1.702 m)   Wt 144 lb 9.6 oz (65.6 kg)   BMI 22.65 kg/m   HEENT - Eac's patent. TM's Nl. EOM's full. PERRLA. NasoOroPharynx clear. Neck - supple. Nl Thyroid. Carotids 2+ & No bruits, nodes, JVD Chest - Clear equal BS w/o Rales, rhonchi, wheezes. Cor - Nl HS. RRR w/o sig MGR. PP 1(+). No edema. Abd - No palpable organomegaly, masses or tenderness. BS nl. MS- FROM w/o deformities.  Gait Nl. Neuro - No obvious Cr N abnormalities. Sensory, motor and Cerebellar functions appear Nl w/o focal abnormalities. Skin - Clear w/o rash, icterus.     Assessment & Plan:   1. Fever, unspecified fever cause  - Urine culture - Urinalysis, Routine w reflex microscopic   - Start Rx Keflex 500 mg 4 x/da pending U/C. - encouraged liberal fluids

## 2016-01-05 NOTE — Patient Instructions (Signed)
Fever, Adult A fever is an increase in the body's temperature. It is usually defined as a temperature of 100F (38C) or higher. Brief mild or moderate fevers generally have no long-term effects, and they often do not require treatment. Moderate or high fevers may make you feel uncomfortable and can sometimes be a sign of a serious illness or disease. The sweating that may occur with repeated or prolonged fever may also cause dehydration. Fever is confirmed by taking a temperature with a thermometer. A measured temperature can vary with:  Age.  Time of day.   HOME CARE INSTRUCTIONS Pay attention to any changes in your symptoms. Take these actions to help with your condition:  Take over-the counter and prescription medicines only as told by your health care provider. Follow the dosing instructions carefully.  If you were prescribed an antibiotic medicine, take it as told by your health care provider. Do not stop taking the antibiotic even if you start to feel better.  Rest as needed.  Drink enough fluid to keep your urine clear or pale yellow. This helps to prevent dehydration.  Sponge yourself or bathe with room-temperature water to help reduce your body temperature as needed. Do not use ice water.  Do not overbundle yourself in blankets or heavy clothes. SEEK MEDICAL CARE IF:  You vomit.  You cannot eat or drink without vomiting.  You have diarrhea.  You have pain when you urinate.  Your symptoms do not improve with treatment.  You develop new symptoms.  You develop excessive weakness. SEEK IMMEDIATE MEDICAL CARE IF:  You have shortness of breath or have trouble breathing.  You are dizzy or you faint.  You are disoriented or confused.  You develop signs of dehydration, such as a dry mouth, decreased urination, or paleness.  You develop severe pain in your abdomen.  You have persistent vomiting or diarrhea.  You develop a skin rash.

## 2016-01-06 ENCOUNTER — Other Ambulatory Visit: Payer: Self-pay | Admitting: Internal Medicine

## 2016-01-06 ENCOUNTER — Encounter (HOSPITAL_COMMUNITY): Payer: Self-pay

## 2016-01-06 ENCOUNTER — Emergency Department (HOSPITAL_COMMUNITY)
Admission: EM | Admit: 2016-01-06 | Discharge: 2016-01-06 | Disposition: A | Discharge status: Home / Self Care | Payer: Commercial Managed Care - PPO | Attending: Emergency Medicine | Admitting: Emergency Medicine

## 2016-01-06 ENCOUNTER — Telehealth: Payer: Self-pay | Admitting: *Deleted

## 2016-01-06 DIAGNOSIS — R509 Fever, unspecified: Secondary | ICD-10-CM | POA: Diagnosis present

## 2016-01-06 DIAGNOSIS — Z791 Long term (current) use of non-steroidal anti-inflammatories (NSAID): Secondary | ICD-10-CM | POA: Diagnosis not present

## 2016-01-06 DIAGNOSIS — Z7722 Contact with and (suspected) exposure to environmental tobacco smoke (acute) (chronic): Secondary | ICD-10-CM | POA: Diagnosis not present

## 2016-01-06 DIAGNOSIS — N10 Acute tubulo-interstitial nephritis: Secondary | ICD-10-CM | POA: Insufficient documentation

## 2016-01-06 LAB — CBC WITH DIFFERENTIAL/PLATELET
BASOS ABS: 0 10*3/uL (ref 0.0–0.1)
BASOS PCT: 0 %
EOS ABS: 0 10*3/uL (ref 0.0–0.7)
EOS PCT: 0 %
HCT: 31.5 % — ABNORMAL LOW (ref 36.0–46.0)
Hemoglobin: 10.7 g/dL — ABNORMAL LOW (ref 12.0–15.0)
Lymphocytes Relative: 5 %
Lymphs Abs: 0.4 10*3/uL — ABNORMAL LOW (ref 0.7–4.0)
MCH: 29.2 pg (ref 26.0–34.0)
MCHC: 34 g/dL (ref 30.0–36.0)
MCV: 85.8 fL (ref 78.0–100.0)
MONO ABS: 1.7 10*3/uL — AB (ref 0.1–1.0)
MONOS PCT: 20 %
NEUTROS ABS: 6.3 10*3/uL (ref 1.7–7.7)
Neutrophils Relative %: 75 %
PLATELETS: 87 10*3/uL — AB (ref 150–400)
RBC: 3.67 MIL/uL — ABNORMAL LOW (ref 3.87–5.11)
RDW: 12.7 % (ref 11.5–15.5)
WBC: 8.4 10*3/uL (ref 4.0–10.5)

## 2016-01-06 LAB — URINE MICROSCOPIC-ADD ON

## 2016-01-06 LAB — URINALYSIS, ROUTINE W REFLEX MICROSCOPIC
BILIRUBIN URINE: NEGATIVE
GLUCOSE, UA: NEGATIVE mg/dL
Glucose, UA: NEGATIVE
KETONES UR: NEGATIVE mg/dL
NITRITE: NEGATIVE
Nitrite: NEGATIVE
PH: 6 (ref 5.0–8.0)
PROTEIN: 100 mg/dL — AB
Specific Gravity, Urine: 1.021 (ref 1.001–1.035)
Specific Gravity, Urine: 1.023 (ref 1.005–1.030)
pH: 5.5 (ref 5.0–8.0)

## 2016-01-06 LAB — COMPREHENSIVE METABOLIC PANEL
ALBUMIN: 3.3 g/dL — AB (ref 3.5–5.0)
ALT: 24 U/L (ref 14–54)
ANION GAP: 12 (ref 5–15)
AST: 26 U/L (ref 15–41)
Alkaline Phosphatase: 63 U/L (ref 38–126)
BILIRUBIN TOTAL: 0.7 mg/dL (ref 0.3–1.2)
BUN: 43 mg/dL — AB (ref 6–20)
CHLORIDE: 99 mmol/L — AB (ref 101–111)
CO2: 23 mmol/L (ref 22–32)
Calcium: 8.8 mg/dL — ABNORMAL LOW (ref 8.9–10.3)
Creatinine, Ser: 1.37 mg/dL — ABNORMAL HIGH (ref 0.44–1.00)
GFR calc Af Amer: >60 mL/min (ref >60)
GFR calc non Af Amer: 55 mL/min — ABNORMAL LOW (ref >60)
GLUCOSE: 108 mg/dL — AB (ref 65–99)
POTASSIUM: 4 mmol/L (ref 3.5–5.1)
SODIUM: 134 mmol/L — AB (ref 135–145)
TOTAL PROTEIN: 7.1 g/dL (ref 6.5–8.1)

## 2016-01-06 LAB — URINALYSIS, MICROSCOPIC ONLY: Yeast: NONE SEEN [HPF]

## 2016-01-06 LAB — LIPASE, BLOOD: Lipase: 14 U/L (ref 11–51)

## 2016-01-06 MED ORDER — ONDANSETRON 4 MG PO TBDP: 4.0000 mg | ORAL_TABLET | Freq: Three times a day (TID) | ORAL | 0 refills | Status: DC | PRN

## 2016-01-06 MED ORDER — ONDANSETRON HCL 4 MG/2ML IJ SOLN
4.0000 mg | Freq: Once | INTRAMUSCULAR | Status: AC
  Administered 2016-01-06: 4 mg via INTRAVENOUS
  Filled 2016-01-06: qty 2

## 2016-01-06 MED ORDER — SODIUM CHLORIDE 0.9 % IV BOLUS (SEPSIS)
1000.0000 mL | Freq: Once | INTRAVENOUS | Status: AC
  Administered 2016-01-06: 1000 mL via INTRAVENOUS

## 2016-01-06 MED ORDER — ACETAMINOPHEN 500 MG PO TABS
1000.0000 mg | ORAL_TABLET | Freq: Once | ORAL | Status: AC
  Administered 2016-01-06: 1000 mg via ORAL
  Filled 2016-01-06: qty 2

## 2016-01-06 MED ORDER — DEXTROSE 5 % IV SOLN
1.0000 g | Freq: Once | INTRAVENOUS | Status: AC
  Administered 2016-01-06: 1 g via INTRAVENOUS
  Filled 2016-01-06: qty 10

## 2016-01-06 NOTE — Telephone Encounter (Signed)
The patient's mother called and reported the patient still has a fever,nausea and vomiting.  She started the Keflex.  Per Dr Oneta RackMcKeown, her UA shows that she has a UTI and with the seizure medications she takes, he suggest the take her to the ER to be evaluated.  The patient's mother is aware.

## 2016-01-06 NOTE — ED Triage Notes (Addendum)
Pt presents with c/o fever. Per mom, pt has been vomiting since yesterday with a fever that started 3 days ago. Pt is also currently taking antibiotics for a bladder infection. Pt is unable to keep anything down on her stomach. Pt reports that she feels very weak. Pt had a temp of 102.4 around 11:30 and she did take Tylenol at that time.

## 2016-01-06 NOTE — ED Provider Notes (Signed)
[facility]-EMERGENCY DEPT Provider Note   CSN: [id number] Arrival date & time: 01/06/16  1332     History   Chief Complaint Chief Complaint  Patient presents with  . Fever    HPI Rebecca Chandler is a 20 y.o. female.  The history is provided by the patient.  Fever   This is a new problem. The current episode started 3 to 5 hours ago. The problem occurs constantly. The problem has not changed since onset.The maximum temperature noted was 101 to 101.9 F. The temperature was taken using an oral thermometer. Associated symptoms include vomiting. Associated symptoms comments: Flank pain on left. Treatments tried: one dose of keflex but possibly vomited up. The treatment provided no relief.    Past Medical History:  Diagnosis Date  . Seizures [facility](HCC)     Patient Active Problem List   Diagnosis Date Noted  . Mild intellectual disability 06/28/2014  . Partial epilepsy with impairment of consciousness, not intractable (HCC) 11/10/2013  . Generalized convulsive epilepsy (HCC) 01/25/2013  . 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 Encounter for long-term (current) use of other medications 01/25/2013    History reviewed. No pertinent surgical history.  OB History    No data available       Home Medications    Prior to Admission medications   Medication Sig Start Date End Date Taking? Authorizing Provider  acetaminophen (TYLENOL) 500 MG tablet Take 500 mg by mouth every 6 (six) hours as needed for fever.   Yes Historical Provider, MD  cephALEXin (KEFLEX) 500 MG capsule Take 1 capsule (500 mg total) by mouth 4 (four) times daily. 01/05/16 01/15/16 Yes Lucky CowboyWilliam McKeown, MD  divalproex (DEPAKOTE ER) 500 MG 24 hr tablet TAKE 2 TABLETS BY MOUTH AT BEDTIME 11/20/15  Yes Elveria Risingina Goodpasture, NP  felbamate (FELBATOL) 600 MG tablet TAKE 2 AND 1/2 TABLETS IN THE MORNING AND 2 AND 1/2 TABLETS AT BEDTIME 11/20/15  Yes Elveria Risingina Goodpasture, NP  ibuprofen (ADVIL,MOTRIN) 200 MG tablet Take 400 mg by mouth every 6 (six)  hours as needed for fever.   Yes Historical Provider, MD  midazolam (VERSED) 5 MG/ML injection Place 2 mLs (10 mg total) into the nose once. Draw up 1ml in 2 syringes, remove blue vial access device, then attach syringe to nasal atomizer for intranasal administration. Give 1ml in each nostril for seizures lasting 2 minutes or longer. Patient not taking: Reported on 01/06/2016 08/24/13   Elveria Risingina Goodpasture, NP    Family History Family History  Problem Relation Age of Onset  . Lung cancer Maternal Grandfather     Died at 7359  . Cancer Paternal Grandmother     Died at 1270    Social History Social History  Substance Use Topics  . Smoking status: Passive Smoke Exposure - Never Smoker  . Smokeless tobacco: Never Used     Comment: Step father smokes   . Alcohol use No     Allergies   Dust mite extract   Review of Systems Review of Systems  Constitutional: Positive for fever.  Gastrointestinal: Positive for vomiting.  All other systems reviewed and are negative.    Physical Exam Updated Vital Signs BP 111/65 (BP Location: Right Arm)   Pulse 115   Temp 101.3 F (38.5 C) (Oral)   Resp 21   Ht 5\' 7"  (1.702 m)   Wt 143 lb (64.9 kg)   LMP 12/23/2015 (Approximate)   SpO2 99%   BMI 22.40 kg/m   Physical Exam  Constitutional: She is oriented  to person, place, and time. She appears well-developed and well-nourished. No distress.  HENT:  Head: Normocephalic.  Nose: Nose normal.  Eyes: Conjunctivae are normal.  Neck: Neck supple. No tracheal deviation present.  Cardiovascular: Regular rhythm.  Tachycardia present.   Pulmonary/Chest: Effort normal and breath sounds normal. No respiratory distress.  Abdominal: Soft. Normal appearance. She exhibits no distension. There is no tenderness. There is CVA tenderness (on left). There is no rigidity, no rebound, no guarding, no tenderness at McBurney's point and negative Murphy's sign.  Neurological: She is alert and oriented to person,  place, and time.  Skin: Skin is warm and dry.  Psychiatric: She has a normal mood and affect.     ED Treatments / Results  Labs (all labs ordered are listed, but only abnormal results are displayed) Labs Reviewed  URINALYSIS, ROUTINE W REFLEX MICROSCOPIC (NOT AT [facility]) - Abnormal; Notable for the following:       Result Value   Color, Urine AMBER (*)    APPearance TURBID (*)    Hgb urine dipstick LARGE (*)    Bilirubin Urine SMALL (*)    Protein, ur 100 (*)    Leukocytes, UA SMALL (*)    All other components within normal limits  CBC WITH DIFFERENTIAL/PLATELET - Abnormal; Notable for the following:    RBC 3.67 (*)    Hemoglobin 10.7 (*)    HCT 31.5 (*)    Platelets 87 (*)    Lymphs Abs 0.4 (*)    Monocytes Absolute 1.7 (*)    All other components within normal limits  COMPREHENSIVE METABOLIC PANEL - Abnormal; Notable for the following:    Sodium 134 (*)    Chloride 99 (*)    Glucose, Bld 108 (*)    BUN 43 (*)    Creatinine, Ser 1.37 (*)    Calcium 8.8 (*)    Albumin 3.3 (*)    GFR calc non Af Amer 55 (*)    All other components within normal limits  URINE MICROSCOPIC-ADD ON - Abnormal; Notable for the following:    Squamous Epithelial / LPF 6-30 (*)    Bacteria, UA MANY (*)    Casts GRANULAR CAST (*)    All other components within normal limits  URINE CULTURE  LIPASE, BLOOD    EKG  EKG Interpretation None       Radiology No results found.  Procedures Procedures (including critical care time)  Medications Ordered in ED Medications  sodium chloride 0.9 % bolus 1,000 mL (0 mLs Intravenous Stopped 01/06/16 1900)  ondansetron (ZOFRAN) injection 4 mg (4 mg Intravenous Given 01/06/16 1737)  sodium chloride 0.9 % bolus 1,000 mL (0 mLs Intravenous Stopped 01/06/16 2007)    And  sodium chloride 0.9 % bolus 1,000 mL (1,000 mLs Intravenous New Bag/Given 01/06/16 1900)  cefTRIAXone (ROCEPHIN) 1 g in dextrose 5 % 50 mL IVPB (0 g Intravenous Stopped 01/06/16 2007)    acetaminophen (TYLENOL) tablet 1,000 mg (1,000 mg Oral Given 01/06/16 2006)  sodium chloride 0.9 % bolus 1,000 mL (1,000 mLs Intravenous New Bag/Given 01/06/16 2015)     Initial Impression / Assessment and Plan / ED Course  I have reviewed the triage vital signs and the nursing notes.  Pertinent labs & imaging results that were available during my care of the patient were reviewed by me and considered in my medical decision making (see chart for details).  Clinical Course    20 y.o. female presents with flank pain and fever starting  over the last 3 days. Was started on keflex by PCP for presumed urine infection but has been vomiting overnight repeatedly and unable to tolerate. Urine is infected with WBC elevation concerning for pyelonephritis. Pt otherwise healthy. Aggressively fluid resuscitated with IV antibiotics administered here. BP is good, clinical appearance improved. Plan to continue keflex administration at home pending culture and close f/u with PCP. Return precautions discussed for worsening or new concerning symptoms.   Final Clinical Impressions(s) / ED Diagnoses   Final diagnoses:  Pyelonephritis, acute    New Prescriptions New Prescriptions   No medications on file     Lyndal Pulleyaniel Kadija Cruzen, MD 01/07/16 [phone]

## 2016-01-07 LAB — URINE CULTURE

## 2016-01-08 ENCOUNTER — Telehealth: Payer: Self-pay | Admitting: *Deleted

## 2016-01-08 ENCOUNTER — Other Ambulatory Visit: Payer: Self-pay | Admitting: Internal Medicine

## 2016-01-08 DIAGNOSIS — N3000 Acute cystitis without hematuria: Secondary | ICD-10-CM

## 2016-01-08 DIAGNOSIS — N390 Urinary tract infection, site not specified: Secondary | ICD-10-CM

## 2016-01-08 LAB — URINE CULTURE

## 2016-01-08 MED ORDER — SULFAMETHOXAZOLE-TRIMETHOPRIM 800-160 MG PO TABS: ORAL_TABLET | ORAL | 0 refills | Status: AC

## 2016-01-08 NOTE — Telephone Encounter (Signed)
Patient's mother called and requested an OOW note for herself from 01/06/2016 to 01/09/2016. She has been out of work due to the patient still being sick with a fever and weakness.  The patient was seen in the ER and given IV fluids.  Per Dr Posey BoyerMcKewon, the mother should force fluids and it is OK for an OOW note.

## 2016-01-14 ENCOUNTER — Encounter: Payer: Self-pay | Admitting: Internal Medicine

## 2016-01-14 ENCOUNTER — Ambulatory Visit (INDEPENDENT_AMBULATORY_CARE_PROVIDER_SITE_OTHER): Payer: Commercial Managed Care - PPO | Admitting: Internal Medicine

## 2016-01-14 VITALS — BP 90/64 | HR 96 | Temp 98.9°F | Resp 16 | Ht 67.0 in | Wt 138.6 lb

## 2016-01-14 DIAGNOSIS — N3000 Acute cystitis without hematuria: Secondary | ICD-10-CM

## 2016-01-14 NOTE — Progress Notes (Signed)
  Subjective:    Patient ID: Rebecca Chandler, female    DOB: 1996-01-25, 20 y.o.   MRN: [id number]  HPI   Patient is a nice 20 yo single WF with seizure disorder who was seen 1 wwek sgo for a UTI and the following day was seen in the ER for IV fluids. Abx's were switched from Keflex to septra to accommodate U/C sensitivities. Pt's mother repsorts that her appetite/intake has been poor and she still has some c/o suprapubic discomfort.   Medication Sig  . acetaminophen (TYLENOL) 500 MG tablet Take 500 mg by mouth every 6 (six) hours as needed for fever.  Marland Kitchen. DEPAKOTE ER 500 MG 24 hr tablet TAKE 2 TABLETS BY MOUTH AT BEDTIME  . felbamate (FELBATOL) 600 MG tablet TAKE 2 AND 1/2 TABLETS IN THE MORNING AND 2 AND 1/2 TABLETS AT BEDTIME  . ibuprofen  200 MG tablet Take 400 mg by mouth every 6 (six) hours as needed for fever.  . ondansetron T 4 MG  Take 1 tablet (4 mg total) by mouth every 8 (eight) hours as needed for nausea or vomiting.  Marland Kitchen. SEPTRA DS Take 1 tablet 2 x / day with food for infection   Allergies  Allergen Reactions  . Dust Mite Extract    Review of Systems  10 point systems review negative except as above.    Objective:   Physical Exam  BP 90/64   Pulse 96   Temp 98.9 F (37.2 C) (Oral)   Resp 16   Ht 5\' 7"  (1.702 m)   Wt 138 lb 9.6 oz (62.9 kg)   LMP 12/23/2015 (Approximate)   BMI 21.71 kg/m   HEENT - Eac's patent. TM's Nl. EOM's full. PERRLA. NasoOroPharynx clear. Neck - supple. Nl Thyroid. Carotids 2+ & No bruits, nodes, JVD Chest - Clear equal BS w/o Rales, rhonchi, wheezes. Cor - Nl HS. RRR w/o sig MGR. PP 1(+). No edema. Abd - No palpable organomegaly, masses or tenderness. BS nl. MS- FROM w/o deformities. Muscle power, tone and bulk Nl. Gait Nl. Neuro - No obvious Cr N abnormalities. Sensory, motor and Cerebellar functions appear Nl w/o focal abnormalities. - Mental status normal & appropriate.  No delusions, ideations or obvious mood abnormalities.      Assessment & Plan:   1. Acute cystitis without hematuria  - Urinalysis, Routine w reflex microscopic  - Urine culture

## 2016-01-14 NOTE — Patient Instructions (Signed)

## 2016-01-15 LAB — URINALYSIS, ROUTINE W REFLEX MICROSCOPIC
BILIRUBIN URINE: NEGATIVE
Glucose, UA: NEGATIVE
KETONES UR: NEGATIVE
Leukocytes, UA: NEGATIVE
NITRITE: NEGATIVE
PROTEIN: NEGATIVE
SPECIFIC GRAVITY, URINE: 1.023 (ref 1.001–1.035)
pH: 6 (ref 5.0–8.0)

## 2016-01-15 LAB — URINALYSIS, MICROSCOPIC ONLY
CRYSTALS: NONE SEEN [HPF]
Casts: NONE SEEN [LPF]
Yeast: NONE SEEN [HPF]

## 2016-01-16 ENCOUNTER — Telehealth: Payer: Self-pay | Admitting: *Deleted

## 2016-01-16 LAB — URINE CULTURE

## 2016-01-16 NOTE — Telephone Encounter (Signed)
Patient's mother called and asked if urine culture final report.  Per Dr Oneta RackMcKeown, it is still pending. Mother also states the patient has discomfort in her epigastric area when she swallows.  Per Dr Oneta RackMcKeown, he suggested she try OTC Ranitidine 150 mg BID over the weekend and can reduce it to 1 daily, if symptoms subside.  The mother also plans to buy Ensure, so the patient can get more nutrients.

## 2016-01-19 ENCOUNTER — Other Ambulatory Visit: Payer: Self-pay

## 2016-05-20 ENCOUNTER — Other Ambulatory Visit: Payer: Self-pay | Admitting: Family

## 2016-05-20 DIAGNOSIS — G40309 Generalized idiopathic epilepsy and epileptic syndromes, not intractable, without status epilepticus: Secondary | ICD-10-CM

## 2016-05-20 DIAGNOSIS — G40209 Localization-related (focal) (partial) symptomatic epilepsy and epileptic syndromes with complex partial seizures, not intractable, without status epilepticus: Secondary | ICD-10-CM

## 2016-05-24 ENCOUNTER — Telehealth (INDEPENDENT_AMBULATORY_CARE_PROVIDER_SITE_OTHER): Payer: Self-pay | Admitting: Pediatrics

## 2016-05-24 NOTE — Telephone Encounter (Signed)
-----   Message from Elveria Risingina Goodpasture, NP sent at 05/20/2016  8:01 AM EDT ----- Regarding: Needs appointment Myla needs an appointment with Dr Sharene SkeansHickling, his resident or me on a day that Dr Sharene SkeansHickling is on the office.  Thanks,  Inetta Fermoina

## 2016-05-24 NOTE — Telephone Encounter (Signed)
Patient mail box is full.

## 2016-05-24 NOTE — Telephone Encounter (Signed)
-----   Message from Tina Goodpasture, NP sent at 05/20/2016  8:01 AM EDT ----- °Regarding: Needs appointment °Rhegan needs an appointment with Dr Hickling, his resident or me on a day that Dr Hickling is on the office.  °Thanks,  °Tina °

## 2016-06-21 ENCOUNTER — Other Ambulatory Visit: Payer: Self-pay | Admitting: Family

## 2016-06-21 DIAGNOSIS — G40309 Generalized idiopathic epilepsy and epileptic syndromes, not intractable, without status epilepticus: Secondary | ICD-10-CM

## 2016-06-21 DIAGNOSIS — G40209 Localization-related (focal) (partial) symptomatic epilepsy and epileptic syndromes with complex partial seizures, not intractable, without status epilepticus: Secondary | ICD-10-CM

## 2016-06-22 ENCOUNTER — Telehealth (INDEPENDENT_AMBULATORY_CARE_PROVIDER_SITE_OTHER): Payer: Self-pay | Admitting: Pediatrics

## 2016-06-22 NOTE — Telephone Encounter (Signed)
LVM to CB to sched 1 year fu appt

## 2016-06-22 NOTE — Telephone Encounter (Signed)
-----   Message from Elveria Rising, NP sent at 06/21/2016  8:20 AM EDT ----- Regarding: Needs appointment Aislyn needs an appointment with Dr Sharene Skeans, his resident or with me on a day that Dr Sharene Skeans is in the office.  Thanks,  Inetta Fermo

## 2016-06-23 ENCOUNTER — Other Ambulatory Visit: Payer: Self-pay | Admitting: Family

## 2016-06-23 DIAGNOSIS — G40209 Localization-related (focal) (partial) symptomatic epilepsy and epileptic syndromes with complex partial seizures, not intractable, without status epilepticus: Secondary | ICD-10-CM

## 2016-06-23 DIAGNOSIS — G40309 Generalized idiopathic epilepsy and epileptic syndromes, not intractable, without status epilepticus: Secondary | ICD-10-CM

## 2016-07-08 ENCOUNTER — Telehealth (INDEPENDENT_AMBULATORY_CARE_PROVIDER_SITE_OTHER): Payer: Self-pay | Admitting: *Deleted

## 2016-07-08 DIAGNOSIS — G40309 Generalized idiopathic epilepsy and epileptic syndromes, not intractable, without status epilepticus: Secondary | ICD-10-CM

## 2016-07-08 DIAGNOSIS — G40209 Localization-related (focal) (partial) symptomatic epilepsy and epileptic syndromes with complex partial seizures, not intractable, without status epilepticus: Secondary | ICD-10-CM

## 2016-07-08 MED ORDER — FELBAMATE 600 MG PO TABS: ORAL_TABLET | ORAL | 1 refills | Status: DC

## 2016-07-08 NOTE — Telephone Encounter (Signed)
  Who's calling (name and relationship to patient) : Tresa EndoKelly, father  Best contact number: [phone]  Provider they see: Dr. Sharene SkeansHickling  Reason for call:  Father came into office to update [place] information and to request refills sent to this pharmacy for Depakote & Felbatol.     PRESCRIPTION REFILL ONLY  Name of prescription: Depakote, Felbatol  [facility]

## 2016-07-08 NOTE — Telephone Encounter (Signed)
The Depakote refill was sent in April with 1 addition refill so that should be enough to last until her appointment later in May. I sent in a Felbatol refill today. TG

## 2016-08-04 ENCOUNTER — Ambulatory Visit (INDEPENDENT_AMBULATORY_CARE_PROVIDER_SITE_OTHER): Payer: Self-pay | Admitting: Pediatrics

## 2016-08-04 ENCOUNTER — Ambulatory Visit (INDEPENDENT_AMBULATORY_CARE_PROVIDER_SITE_OTHER): Payer: Commercial Managed Care - PPO | Admitting: Pediatrics

## 2016-08-19 ENCOUNTER — Encounter (INDEPENDENT_AMBULATORY_CARE_PROVIDER_SITE_OTHER): Payer: Self-pay | Admitting: Pediatrics

## 2016-08-19 ENCOUNTER — Ambulatory Visit (INDEPENDENT_AMBULATORY_CARE_PROVIDER_SITE_OTHER): Payer: 59 | Admitting: Pediatrics

## 2016-08-19 ENCOUNTER — Ambulatory Visit (INDEPENDENT_AMBULATORY_CARE_PROVIDER_SITE_OTHER): Payer: Self-pay | Admitting: Pediatrics

## 2016-08-19 VITALS — BP 108/72 | HR 88 | Ht 66.5 in | Wt 156.0 lb

## 2016-08-19 DIAGNOSIS — G40209 Localization-related (focal) (partial) symptomatic epilepsy and epileptic syndromes with complex partial seizures, not intractable, without status epilepticus: Secondary | ICD-10-CM | POA: Diagnosis not present

## 2016-08-19 DIAGNOSIS — F70 Mild intellectual disabilities: Secondary | ICD-10-CM | POA: Diagnosis not present

## 2016-08-19 DIAGNOSIS — G40309 Generalized idiopathic epilepsy and epileptic syndromes, not intractable, without status epilepticus: Secondary | ICD-10-CM | POA: Diagnosis not present

## 2016-08-19 MED ORDER — FELBAMATE 600 MG PO TABS: ORAL_TABLET | ORAL | 3 refills | Status: DC

## 2016-08-19 MED ORDER — DIVALPROEX SODIUM ER 500 MG PO TB24: 1000.0000 mg | ORAL_TABLET | Freq: Every day | ORAL | 3 refills | Status: DC

## 2016-08-19 NOTE — Patient Instructions (Signed)
Please let me know if the frequency of seizures changes significantly.  We will help anyway that we can with the disability process, but we do not to the disability exams.

## 2016-08-19 NOTE — Progress Notes (Signed)
Patient: Rebecca Chandler MRN: [id number] Sex: female DOB: Oct 15, 1995  Provider: Ellison Carwin, MD Location of Care: [facility]  Note type: Routine return visit  History of Present Illness: Referral Source: Dr. Oneta Rack History from: step-father, patient and [facility] chart Chief Complaint: Follow up on seizures  Rebecca Chandler is a 21 y.o. female who returns on August 19, 2016 for the first time since May 15, 2015.  She has partial epilepsy with impairment of consciousness, a history of generalized tonic-clonic and myoclonic seizures.  Claudetta has experienced two seizures she was last seen, one on February 12, 2016 and one, this morning.  Last night she was up until 2 or 3 in the morning.  Her brother had come over.  She had to get up at 7 in order to get to this appointment.  Her boyfriend noted that she had a seizure lasting for 7 to 8 minutes.  For reasons that are incomprehensible to me, he did not speak with the patient's father during that time to enlist his help.  As best as we know, she had a period of unresponsive staring without tongue biting or urinary incontinence.  She does not appear postictal at this time.  Typically Rebecca Chandler goes to bed around 11 p.m. and sleeps until 10 a.m.  That is 11 hours of sleep versus no more than 5 last night.  Rebecca Chandler lives with her mother for the most part.  She sees her father two to three days a month.  She was with him last night so that he could bring her to this office visit today.  Rebecca Chandler.  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  Her boyfriend lives with her.  They have been together for 5 years.  Rebecca Chandler is healthy except that she has gained 16 pounds since she was last seen.  In addition to her antiepileptic medicines, she had Nexplanon placed for contraception which is a very good thing.  For the most part, she is very compliant in taking divalproex and felbamate.  I explained to her that she needed to  remain on birth control because these medicines are essential to controlling her seizures and the concurrent use of Depakote during pregnancy has about a 10% risk of a significant birth defect.  Usually this is neural tube defect with or without hydrocephalus; sometimes it is microcephaly.  Intellectual disability is also possible.  Gwenetta has been on a variety of other medications.  This is definitely been the best combination.  I suspect that when she does not get enough sleep that lowers her seizure threshold.  I do not know if the explanation is that simple.  Review of Systems: 12 system review was assessed and was negative  Past Medical History Diagnosis Date  . Seizures (HCC)    Hospitalizations: No., Head Injury: No., Nervous System Infections: No., Immunizations up to date: Yes.    Rebecca Chandler had onset of seizures when she was [age] months of age. Seizures were myoclonic associated with widening of her eyes. Mother remembers that I used Depakote initially. At some point, she was switched to [facility]. She was able to come off antiepileptic medications and was seizure free from age 30 to 70.  At that time, she had nocturnal arousals with staring, grunting, and sucking sounds with opening and closing her eyelids lasting for five minutes. Her head turned toward the right, these increased from once a month to once a week.  The patient was sent to [facility] for the first of many EMU evaluations. No events were recorded. She was placed on Tegretol and Keppra. Tegretol was discontinued and Felbatol restarted. In January 2003, she had a 6-day EMU evaluation that showed rare interictal right hemispheric sharp waves, she remained on Keppra and Felbatol.  In December 2009, she had simple and complex partial seizures monthly, typically associated with left-sided jerking and left Todd's paresis, these lasted five to eight minutes and tended to occur between midnight and 5 a.m. She was on Felbatol  and Lamictal.   On August 12, 2008, the patient had an EEG that showed frequent high amplitude spike discharges bifrontally that occurred in runs of three to four, this was more prominent over the right than the left hemisphere particularly at Fp2 and F4. She had one ictal event that happened after her medications had been withdrawn. She extended her left leg upward and outward, her head deviated tonically to the right, she vocalized and had tonic stiffening of her entire body, she was cyanotic, she had clonic activity right greater than left. This started in the left parasagittal region, migrated to the left temple followed by generalized myogenic artifact. This was followed by a rhythmic 2 Hz spike and wave abnormality prominent over the right hemisphere and declined in frequency.  Ictal SPECT showed uptake in the left frontal region. This was believed to be not typical of her seizures. She was sent home on Lamictal and Trileptal, but was unable to tolerate Trileptal and was placed on Felbatol.   The record showed some confusion, but the patient was on the combination of Lamictal and Felbatol when I saw her next on September 20, 2011. She lost a tremendous amount of weight, but unfortunately began to experience myoclonus. Myoclonus was fairly frequent, but generalized seizures were relatively infrequent.  Birth History Term infant to a 51 year old primigravida female  Normal spontaneous vaginal delivery  Nursery course was unremarkable.  Development was normal except for problems with articulation. I believe that she has intellectual disability in the form of significant learning differences.  Behavior History none  Surgical History No past surgical history on file.  Family History family history includes Cancer in her paternal grandmother; Lung cancer in her maternal grandfather. Family history is negative for migraines, seizures, intellectual disabilities, blindness, deafness, birth  defects, chromosomal disorder, or autism.  Social History . Marital status: Single  . Years of education: 24   Social History Main Topics  . Smoking status: Passive Smoke Exposure - Never Smoker  . Smokeless tobacco: Never Used     Comment: Step father smokes   . Alcohol use No  . Drug use: No  . Sexual activity: Not Currently    Birth control/ protection: None   Social History Narrative    Rebecca Chandler is a 21 yo woman who has graduated. She lives with her mother and stepfather. She has 2 brothers. She enjoys sleeping, listening to music and talking to friends.   Allergies Allergen Reactions  . Dust Mite Extract    Physical Exam BP 108/72   Pulse 88   Ht 5' 6.5" (1.689 m)   Wt 156 lb (70.8 kg)   LMP 08/12/2016 (Approximate)   BMI 24.80 kg/m   General: alert, well developed, well nourished, in no acute distress, brown hair, hazel eyes, right handed Head: normocephalic, no dysmorphic features Ears, Nose and Throat: Otoscopic: tympanic membranes normal; pharynx: oropharynx is pink without exudates or tonsillar hypertrophy Neck: supple, full range of motion, no cranial or  cervical bruits Respiratory: auscultation clear Cardiovascular: no murmurs, pulses are normal Musculoskeletal: no skeletal deformities or apparent scoliosis Skin: no rashes or neurocutaneous lesions  Neurologic Exam  Mental Status: alert; oriented to person, place and year; knowledge is below normal for age; language is normal Cranial Nerves: visual fields are full to double simultaneous stimuli; extraocular movements are full and conjugate; pupils are round reactive to light; funduscopic examination shows sharp disc margins with normal vessels; symmetric facial strength; midline tongue and uvula; air conduction is greater than bone conduction bilaterally Motor: Normal strength, tone and mass; good fine motor movements; no pronator drift Sensory: intact responses to cold, vibration, proprioception and  stereognosis Coordination: good finger-to-nose, rapid repetitive alternating movements and finger apposition Gait and Station: normal gait and station: patient is able to walk on heels, toes and tandem without difficulty; balance is adequate; Romberg exam is negative Reflexes: symmetric and diminished bilaterally; no clonus; bilateral flexor plantar responses  Assessment 1. Partial epilepsy with impairment of consciousness, not intractable, G40.209. 2. Generalized convulsive epilepsy, G40.309. 3. Mild intellectual disability, F70.  Discussion Rebecca Chandler is stable.  This is actually fairly good control of her seizures.  We discussed the conditions that lower seizure threshold which include the lack of sleep, use of alcohol, and non-compliance with medication.  Elevated fever with illness also would lower threshold.  There is a plan to file disability for SSI.  I explained to [place] and her father that we would cooperate, but that we did not do disability examinations.  In her situation she has mild intellectual disability, she is physically and neurologically otherwise normal and her seizures are not frequent enough to significantly interfere with her job.  It is highly likely that she will not receive disability.  It is also problematic in achieving gainful employment.  She did well in the vocational activities in high school, but unfortunately this has not resulted in gainful employment as an adult.  There are probably many reasons for this.  Plan I refilled her prescriptions for felbamate and for divalproex.  She will return to see me in a year, but I will be happy to see her sooner based on clinical need.  I spent 25 minutes of face-to-face time with Rebecca Chandler and her father.   Medication List   Accurate as of 08/19/16  8:21 AM.        acetaminophen 500 MG tablet Commonly known as:  TYLENOL Take 500 mg by mouth every 6 (six) hours as needed for fever.   divalproex 500 MG 24 hr  tablet Commonly known as:  DEPAKOTE ER TAKE 2 TABLETS BY MOUTH AT BEDTIME   felbamate 600 MG tablet Commonly known as:  FELBATOL TAKE 2 AND 1/2 TABLETS IN THE MORNING AND 2 AND 1/2 TABLETS AT BEDTIME   ibuprofen 200 MG tablet Commonly known as:  ADVIL,MOTRIN Take 400 mg by mouth every 6 (six) hours as needed for fever.   midazolam 5 MG/ML injection Commonly known as:  VERSED Place 2 mLs (10 mg total) into the nose once. Draw up 1ml in 2 syringes, remove blue vial access device, then attach syringe to nasal atomizer for intranasal administration. Give 1ml in each nostril for seizures lasting 2 minutes or longer.   NEXPLANON 68 MG Impl implant Generic drug:  etonogestrel Nexplanon 68 mg subdermal implant  Inject by subcutaneous route. Due for removal in 3 years   ondansetron 4 MG disintegrating tablet Commonly known as:  ZOFRAN ODT Take 1 tablet (4 mg total)  by mouth every 8 (eight) hours as needed for nausea or vomiting.     The medication list was reviewed and reconciled. All changes or newly prescribed medications were explained.  A complete medication list was provided to the patient/caregiver.  Deetta PerlaWilliam H Casimir Barcellos MD

## 2016-08-22 ENCOUNTER — Other Ambulatory Visit: Payer: Self-pay | Admitting: Family

## 2016-08-22 DIAGNOSIS — G40309 Generalized idiopathic epilepsy and epileptic syndromes, not intractable, without status epilepticus: Secondary | ICD-10-CM

## 2016-08-22 DIAGNOSIS — G40209 Localization-related (focal) (partial) symptomatic epilepsy and epileptic syndromes with complex partial seizures, not intractable, without status epilepticus: Secondary | ICD-10-CM

## 2016-08-23 NOTE — Telephone Encounter (Signed)
Routed to Elveria Risingina Goodpasture NP for clarification about medication. Appears filled 08/19/16 at [facility]- [facility] [place].

## 2016-08-26 ENCOUNTER — Telehealth (INDEPENDENT_AMBULATORY_CARE_PROVIDER_SITE_OTHER): Payer: Self-pay | Admitting: Pediatrics

## 2016-08-26 ENCOUNTER — Other Ambulatory Visit (INDEPENDENT_AMBULATORY_CARE_PROVIDER_SITE_OTHER): Payer: Self-pay

## 2016-08-26 DIAGNOSIS — G40309 Generalized idiopathic epilepsy and epileptic syndromes, not intractable, without status epilepticus: Secondary | ICD-10-CM

## 2016-08-26 DIAGNOSIS — G40209 Localization-related (focal) (partial) symptomatic epilepsy and epileptic syndromes with complex partial seizures, not intractable, without status epilepticus: Secondary | ICD-10-CM

## 2016-08-26 MED ORDER — DIVALPROEX SODIUM ER 500 MG PO TB24: 1000.0000 mg | ORAL_TABLET | Freq: Every day | ORAL | 0 refills | Status: DC

## 2016-08-26 MED ORDER — FELBAMATE 600 MG PO TABS: ORAL_TABLET | ORAL | 2 refills | Status: DC

## 2016-08-26 NOTE — Telephone Encounter (Signed)
Refills have been sent to the pharmacy on file.  

## 2016-08-26 NOTE — Telephone Encounter (Signed)
Medication has been sent to the pharmacy. 

## 2016-08-26 NOTE — Telephone Encounter (Signed)
  Who's calling (name and relationship to patient) : [name], mother  Best contact number: [phone]  Provider they see: Sharene SkeansHickling  Reason for call: Mother called in stating she needs to know what is going on with the prescriptions going to [facility].  Mother states they have been going through this for 4 months now and still have not received their prescriptions.  Rebecca Chandler is out of Depakote and running low on Felbatol.  Please call mother at [phone].     PRESCRIPTION REFILL ONLY  Name of prescription:  Pharmacy:

## 2016-09-23 ENCOUNTER — Other Ambulatory Visit: Payer: Self-pay | Admitting: Family

## 2016-09-23 DIAGNOSIS — G40209 Localization-related (focal) (partial) symptomatic epilepsy and epileptic syndromes with complex partial seizures, not intractable, without status epilepticus: Secondary | ICD-10-CM

## 2016-09-23 DIAGNOSIS — G40309 Generalized idiopathic epilepsy and epileptic syndromes, not intractable, without status epilepticus: Secondary | ICD-10-CM

## 2017-06-23 ENCOUNTER — Telehealth (INDEPENDENT_AMBULATORY_CARE_PROVIDER_SITE_OTHER): Payer: Self-pay | Admitting: Pediatrics

## 2017-06-23 NOTE — Telephone Encounter (Signed)
L/M informing mom that we did receive the forms

## 2017-06-23 NOTE — Telephone Encounter (Signed)
Who's calling (name and relationship to patient) : Smith,Teresa P (Mother) Best contact number: [phone] Provider they see: Sharene SkeansHickling, MD  Reason for call: Mother of patient is calling in regards to needing Dr. Sharene SkeansHickling to complete a form "[place]" mother will be faxing the form over today [phone] at 10am. Mother requested a call onced received. I did make mother aware of possible fee for paperwork.

## 2017-06-23 NOTE — Telephone Encounter (Signed)
Received the forms. Placed them on Dr. Darl HouseholderHickling's desk

## 2017-08-21 ENCOUNTER — Other Ambulatory Visit (INDEPENDENT_AMBULATORY_CARE_PROVIDER_SITE_OTHER): Payer: Self-pay | Admitting: Pediatrics

## 2017-08-21 DIAGNOSIS — G40209 Localization-related (focal) (partial) symptomatic epilepsy and epileptic syndromes with complex partial seizures, not intractable, without status epilepticus: Secondary | ICD-10-CM

## 2017-08-21 DIAGNOSIS — G40309 Generalized idiopathic epilepsy and epileptic syndromes, not intractable, without status epilepticus: Secondary | ICD-10-CM

## 2017-09-13 ENCOUNTER — Other Ambulatory Visit (INDEPENDENT_AMBULATORY_CARE_PROVIDER_SITE_OTHER): Payer: Self-pay | Admitting: Pediatrics

## 2017-09-13 DIAGNOSIS — G40209 Localization-related (focal) (partial) symptomatic epilepsy and epileptic syndromes with complex partial seizures, not intractable, without status epilepticus: Secondary | ICD-10-CM

## 2017-09-13 DIAGNOSIS — G40309 Generalized idiopathic epilepsy and epileptic syndromes, not intractable, without status epilepticus: Secondary | ICD-10-CM

## 2017-11-24 ENCOUNTER — Other Ambulatory Visit (INDEPENDENT_AMBULATORY_CARE_PROVIDER_SITE_OTHER): Payer: Self-pay | Admitting: Pediatrics

## 2017-11-24 DIAGNOSIS — G40209 Localization-related (focal) (partial) symptomatic epilepsy and epileptic syndromes with complex partial seizures, not intractable, without status epilepticus: Secondary | ICD-10-CM

## 2017-11-24 DIAGNOSIS — G40309 Generalized idiopathic epilepsy and epileptic syndromes, not intractable, without status epilepticus: Secondary | ICD-10-CM

## 2017-11-24 NOTE — Telephone Encounter (Signed)
Rx has been sent electronically to the pharmacy 

## 2018-01-09 ENCOUNTER — Other Ambulatory Visit (INDEPENDENT_AMBULATORY_CARE_PROVIDER_SITE_OTHER): Payer: Self-pay | Admitting: Pediatrics

## 2018-01-09 DIAGNOSIS — G40309 Generalized idiopathic epilepsy and epileptic syndromes, not intractable, without status epilepticus: Secondary | ICD-10-CM

## 2018-01-09 DIAGNOSIS — G40209 Localization-related (focal) (partial) symptomatic epilepsy and epileptic syndromes with complex partial seizures, not intractable, without status epilepticus: Secondary | ICD-10-CM

## 2018-02-03 ENCOUNTER — Other Ambulatory Visit (INDEPENDENT_AMBULATORY_CARE_PROVIDER_SITE_OTHER): Payer: Self-pay | Admitting: Pediatrics

## 2018-02-03 DIAGNOSIS — G40309 Generalized idiopathic epilepsy and epileptic syndromes, not intractable, without status epilepticus: Secondary | ICD-10-CM

## 2018-02-03 DIAGNOSIS — G40209 Localization-related (focal) (partial) symptomatic epilepsy and epileptic syndromes with complex partial seizures, not intractable, without status epilepticus: Secondary | ICD-10-CM

## 2018-02-06 ENCOUNTER — Other Ambulatory Visit (INDEPENDENT_AMBULATORY_CARE_PROVIDER_SITE_OTHER): Payer: Self-pay | Admitting: Pediatrics

## 2018-02-06 ENCOUNTER — Telehealth (INDEPENDENT_AMBULATORY_CARE_PROVIDER_SITE_OTHER): Payer: Self-pay | Admitting: Pediatrics

## 2018-02-06 DIAGNOSIS — G40309 Generalized idiopathic epilepsy and epileptic syndromes, not intractable, without status epilepticus: Secondary | ICD-10-CM

## 2018-02-06 DIAGNOSIS — G40209 Localization-related (focal) (partial) symptomatic epilepsy and epileptic syndromes with complex partial seizures, not intractable, without status epilepticus: Secondary | ICD-10-CM

## 2018-02-06 MED ORDER — FELBAMATE 600 MG PO TABS: ORAL_TABLET | ORAL | 0 refills | Status: DC

## 2018-02-06 NOTE — Telephone Encounter (Signed)
°  Who's calling (name and relationship to patient) : Patient   Best contact number: [phone]  Provider they see:  Dr. Sharene SkeansHickling   Reason for call: Medication Refill. Patient is completely and needs a dose tonight.      PRESCRIPTION REFILL ONLY  Name of prescription: Felbamate   [place] [facility] [place]

## 2018-02-06 NOTE — Telephone Encounter (Signed)
We can not talk to mom due to the patient being 18+ and no DPR is signed

## 2018-02-06 NOTE — Telephone Encounter (Signed)
Wardell HeathBritany number is [phone]

## 2018-02-06 NOTE — Telephone Encounter (Signed)
Rx was sent electronically. No further refills will be given

## 2018-02-06 NOTE — Telephone Encounter (Signed)
Rebecca Chandler (mom) [phone]  [name] [phone]  Rebecca Chandler called back to say that once the pharmacy got Rebecca Chandler's prescription they are out and will not have any until tomorrow, so she will miss tonight and tomorrow morning dose of Felbanate, what does she need to do? Should she double up on Dapokote?

## 2018-02-06 NOTE — Telephone Encounter (Signed)
°  Who's calling (name and relationship to patient) : [name] (mom) Best contact number: [phone] Provider they see: Sharene SkeansHickling  Reason for call: Mom called on patient behalf for medication refill.  Chart noted a DPR was need last year for patient mother.  I called number listed for patient, no voicemail was set up.   I called mom back LVM letting mom know that patient in need to contact the office for her refill.  Mom also stated that the patient was in [place].  Patient is past due for a followup appt and has had 2 refills since last visit in 2018    PRESCRIPTION REFILL ONLY  Name of prescription:  Pharmacy:

## 2018-02-06 NOTE — Telephone Encounter (Signed)
I spoke to the patient and told her that there is nothing to do and that she should take the medication when it was available to her.  I told her that we could not continue to provide prescriptions.  Is been nearly 18 months since she was seen.  I told her that either she was going to have to get a physician in [place] which would be very difficult to do or if the family was going to need to get [place] to [place] and we would work with them to get her an appointment.  I am willing to refill a prescription for 1 more month under those circumstances but not beyond that.  I also told her that we could not talk with her parents because she is an adult.  We have to have releases in order to do that.

## 2018-02-07 ENCOUNTER — Other Ambulatory Visit (INDEPENDENT_AMBULATORY_CARE_PROVIDER_SITE_OTHER): Payer: Self-pay | Admitting: Pediatrics

## 2018-02-07 DIAGNOSIS — G40309 Generalized idiopathic epilepsy and epileptic syndromes, not intractable, without status epilepticus: Secondary | ICD-10-CM

## 2018-02-07 DIAGNOSIS — G40209 Localization-related (focal) (partial) symptomatic epilepsy and epileptic syndromes with complex partial seizures, not intractable, without status epilepticus: Secondary | ICD-10-CM

## 2018-02-13 ENCOUNTER — Encounter (INDEPENDENT_AMBULATORY_CARE_PROVIDER_SITE_OTHER): Payer: Self-pay | Admitting: Pediatrics

## 2018-02-13 ENCOUNTER — Ambulatory Visit (INDEPENDENT_AMBULATORY_CARE_PROVIDER_SITE_OTHER): Payer: 59 | Admitting: Pediatrics

## 2018-02-13 VITALS — BP 110/70 | HR 72 | Ht 67.0 in | Wt 171.2 lb

## 2018-02-13 DIAGNOSIS — F70 Mild intellectual disabilities: Secondary | ICD-10-CM | POA: Diagnosis not present

## 2018-02-13 DIAGNOSIS — G40209 Localization-related (focal) (partial) symptomatic epilepsy and epileptic syndromes with complex partial seizures, not intractable, without status epilepticus: Secondary | ICD-10-CM | POA: Diagnosis not present

## 2018-02-13 DIAGNOSIS — G40309 Generalized idiopathic epilepsy and epileptic syndromes, not intractable, without status epilepticus: Secondary | ICD-10-CM | POA: Diagnosis not present

## 2018-02-13 MED ORDER — DIVALPROEX SODIUM ER 500 MG PO TB24: ORAL_TABLET | ORAL | 3 refills | Status: DC

## 2018-02-13 MED ORDER — FELBAMATE 600 MG PO TABS: ORAL_TABLET | ORAL | 3 refills | Status: DC

## 2018-02-13 NOTE — Patient Instructions (Addendum)
I have written these prescriptions for 90-day refill.  It looks like it is through a specialty group [facility].  If we need to send this, please let me know and I will rewrite the prescription.  There is going to be filled through our [facility] [place] that is a [facility] Services operational officerdifferent matter.  Please let me know.  If Rebecca Chandler gets a neurologist in [place] we will transfer information in the form of records.  For the time being we will be happy to continue to follow her here in [place] but have to see her at least once a year.

## 2018-02-13 NOTE — Progress Notes (Signed)
Patient: Rebecca Chandler MRN: [id number] Sex: female DOB: 01/20/1996  Provider: Ellison Carwin, MD Location of Care: [facility] Child Neurology  Note type: Routine return visit  History of Present Illness: Referral Source: Rebecca Chandler History from: patient and [facility] chart Chief Complaint: Seizures  Rebecca Chandler is a 22 y.o. female who was evaluated on February 13, 2018 for the first time since August 19, 2016.  She has partial epilepsy with impairment of consciousness and generalized tonic-clonic and myoclonic seizures.  She thinks that she has had 2 seizures in the past month, 1 that occurred on November 9.  She cannot remember when the other was.  She is not certain if she has had other seizures since she was last seen.  Except for prescription refills, there has been no contact with the family since she was seen in June 2018.  I received a call on December 2, stating that she was out of medication and it needed to be refilled.  It actually had been refilled but was not going to be able to be given to the patient until the day after.  I called the patient and told her that I could not speak with her parents because she is now an adult.  I have to have a release of information to do so.  I further told her that I needed to be able to see her within the near future because it had been over 18 months since she had been seen.    She flew from [place] to [place].  We were able to arrange this appointment.  I told her that either I had to see her on a yearly basis, or we would need to arrange for a physician to see her in [place], which is where she is currently staying.  She does not know how long she will be with her mother.  She has been receiving her medication through a specialty company, [employer].  I am not certain what the logistics of this are given that her stepfather is in [place] and her mother is in [place].  I wrote prescriptions out for felbamate and divalproex  and told her that I would do whatever was needed in order to make certain that she had a steady supply.  The patient's health has been good except for weight gain.  She is up 15 pounds since I saw her last.  It is not clear to me if this is related to her medication, to sedentary activity, or combination of both.  The patient is not working outside the home.  She is trying to get SSI in [place].  Given the infrequency of her seizures, I think that is going to be difficult.  There is a possibility that because of her intellectual disability that may carry more weight than her fairly well-controlled seizures.  She has been given a puppy by her mother that is a mixed  Jersey and Dachshund.  I think that she enjoys being with her mother who lives in [place], [place].  No other concerns were raised today.  Review of Systems: A complete review of systems was remarkable for patient reports that she has had two seizures this month. She states that she does not rememebr if she has had more than that in the last 18 months. No other concerns, all other systems reviewed and negative.  Past Medical History Diagnosis Date  . Seizures (HCC)    Hospitalizations: No., Head Injury: No., Nervous System Infections:  No., Immunizations up to date: Yes.    [name] had onset of seizures when she was [age] months of age. Seizures were myoclonic associated with widening of her eyes. Mother remembers that I used Depakote initially. [facility] She was able to come off antiepileptic medications and was seizure free from age 65 to 89.  At that time, she had nocturnal arousals with staring, grunting, and sucking sounds with opening and closing her eyelids lasting for five minutes. Her head turned toward the right, these increased from once a month to once a week.  [facility] of many EMU evaluations. No events were recorded. She was placed on  Tegretol and Keppra. Tegretol was discontinued and Felbatol restarted. In January 2003, she had a 6-day EMU evaluation that showed rare interictal right hemispheric sharp waves, she remained on Keppra and Felbatol.  In December 2009, she had simple and complex partial seizures monthly, typically associated with left-sided jerking and left Todd's paresis, these lasted five to eight minutes and tended to occur between midnight and 5 a.m. She was on Felbatol and Lamictal.   On August 12, 2008, the patient had an EEG that showed frequent high amplitude spike discharges bifrontally that occurred in runs of three to four, this was more prominent over the right than the left hemisphere particularly at Fp2 and F4. She had one ictal event that happened after her medications had been withdrawn. She extended her left leg upward and outward, her head deviated tonically to the right, she vocalized and had tonic stiffening of her entire body, she was cyanotic, she had clonic activity right greater than left. This started in the left parasagittal region, migrated to the left temple followed by generalized myogenic artifact. This was followed by a rhythmic 2 Hz spike and wave abnormality prominent over the right hemisphere and declined in frequency.  Ictal SPECT showed uptake in the left frontal region. This was believed to be not typical of her seizures. She was sent home on Lamictal and Trileptal, but was unable to tolerate Trileptal and was placed on Felbatol.   The record showed some confusion, but the patient was on the combination of Lamictal and Felbatol when I saw her next on September 20, 2011. She lost a tremendous amount of weight, but unfortunately began to experience myoclonus. Myoclonus was fairly frequent, but generalized seizures were relatively infrequent.  Birth History Term infant to a 89 year old primigravida female  Normal spontaneous vaginal delivery  Nursery course was unremarkable.   Development was normal except for problems with articulation. I believe that she has intellectual disability in the form of significant learning differences.  Behavior History none  Surgical History History reviewed. No pertinent surgical history.  Family History family history includes Cancer in her paternal grandmother; Lung cancer in her maternal grandfather. Family history is negative for migraines, seizures, intellectual disabilities, blindness, deafness, birth defects, chromosomal disorder, or autism.  Social History Socioeconomic History  . Marital status: Single  . Years of education:  4313  . Highest education level:  High school graduate  Occupational History  .  Not working  Social Needs  . Financial resource strain: Not on file  . Food insecurity:    Worry: Not on file    Inability: Not on file  . Transportation needs:    Medical: Not on file    Non-medical: Not on file  Tobacco Use  . Smoking status: Passive Smoke  Exposure - Never Smoker  . Smokeless tobacco: Never Used  . Tobacco comment: Step father smokes   Substance and Sexual Activity  . Alcohol use: No    Alcohol/week: 0.0 standard drinks  . Drug use: No  . Sexual activity: Not Currently    Birth control/protection: None  Social History Narrative    Sylvia is a 22 yo woman who has graduated. She lives with her mother and stepfather. She has 2 brothers. She enjoys sleeping, listening to music and talking to friends. Hadlyn is not currently working or in school.    Allergies Allergen Reactions  . Dust Mite Extract    Physical Exam BP 110/70   Pulse 72   Ht 5\' 7"  (1.702 m)   Wt 171 lb 3.2 oz (77.7 kg)   BMI 26.81 kg/m   General: alert, well developed, well nourished, in no acute distress, sandy/brown hair, hazel eyes, right handed Head: normocephalic, no dysmorphic features Ears, Nose and Throat: Otoscopic: tympanic membranes normal; pharynx: oropharynx is pink without exudates or tonsillar  hypertrophy Neck: supple, full range of motion, no cranial or cervical bruits Respiratory: auscultation clear Cardiovascular: no murmurs, pulses are normal Musculoskeletal: no skeletal deformities or apparent scoliosis Skin: no rashes or neurocutaneous lesions  Neurologic Exam  Mental Status: alert; oriented to person, place and year; knowledge is normal for age; language is normal Cranial Nerves: visual fields are full to double simultaneous stimuli; extraocular movements are full and conjugate; pupils are round reactive to light; funduscopic examination shows sharp disc margins with normal vessels; symmetric facial strength; midline tongue and uvula; air conduction is greater than bone conduction bilaterally Motor: Normal strength, tone and mass; good fine motor movements; no pronator drift Sensory: intact responses to cold, vibration, proprioception and stereognosis Coordination: good finger-to-nose, rapid repetitive alternating movements and finger apposition Gait and Station: normal gait and station: patient is able to walk on heels, toes and tandem without difficulty; balance is adequate; Romberg exam is negative; Gower response is negative Reflexes: symmetric and diminished bilaterally; no clonus; bilateral flexor plantar responses  Assessment 1. Generalized convulsive epilepsy, G40.309. 2. Partial epilepsy with impairment of consciousness, not intractable, G40.209. 3. Mild intellectual disability, F70.   Discussion As I mentioned I am not certain whether this is being obtained through father's insurance in which case he may have to fill the prescription and send it to his daughter.  Sending medication through the mail is always difficult.    Plan I wrote a prescription for 90 day refills and gave her separate prescriptions for divalproex and felbamate.  I asked her to return to see me in a year and told her that for the time being I would continue to follow her unless she and her  mother found an adult neurologist in their community.  In that case, I would be happy to send records.  I told her that it is very important for her to continue to take her medication as we prescribed and that I would do what I could to make certain that she had non-interrupted supply.  She had no further questions.  She will return to see me in a year.  Greater than 50% of a 25 minute visit was spent in counseling and coordination of care concerning her seizures and her need for compliance, as well as my desire to make certain that she receive prescription refills on a timely basis.   Medication List    Accurate as of 02/13/18 11:59 AM.  amoxicillin-clavulanate 875-125 MG tablet Commonly known as:  AUGMENTIN amoxicillin 875 mg-potassium clavulanate 125 mg tablet  TAKE 1 TABLET BY MOUTH TWICE A DAY   divalproex 500 MG 24 hr tablet Commonly known as:  DEPAKOTE ER TAKE 2 TABLETS BY MOUTH EVERY NIGHT AT BEDTIME( PLEASE SCHEDULE APPOINTMENT [phone])   felbamate 600 MG tablet Commonly known as:  FELBATOL TAKE 2 AND 1/2 TABLETS BY MOUTH EVERY MORNING AND EVERY NIGHT AT BEDTIME   midazolam 5 MG/ML injection Commonly known as:  VERSED Place 2 mLs (10 mg total) into the nose once. Draw up 1ml in 2 syringes, remove blue vial access device, then attach syringe to nasal atomizer for intranasal administration. Give 1ml in each nostril for seizures lasting 2 minutes or longer.   NEXPLANON 68 MG Impl implant Generic drug:  etonogestrel Nexplanon 68 mg subdermal implant  Inject by subcutaneous route. Due for removal in 3 years    The medication list was reviewed and reconciled. All changes or newly prescribed medications were explained.  A complete medication list was provided to the patient/caregiver.  Deetta Perla MD

## 2018-03-20 ENCOUNTER — Other Ambulatory Visit (INDEPENDENT_AMBULATORY_CARE_PROVIDER_SITE_OTHER): Payer: Self-pay | Admitting: Pediatrics

## 2018-03-20 DIAGNOSIS — G40209 Localization-related (focal) (partial) symptomatic epilepsy and epileptic syndromes with complex partial seizures, not intractable, without status epilepticus: Secondary | ICD-10-CM

## 2018-03-20 DIAGNOSIS — G40309 Generalized idiopathic epilepsy and epileptic syndromes, not intractable, without status epilepticus: Secondary | ICD-10-CM

## 2018-04-03 ENCOUNTER — Telehealth (INDEPENDENT_AMBULATORY_CARE_PROVIDER_SITE_OTHER): Payer: Self-pay | Admitting: Pediatrics

## 2018-04-03 NOTE — Telephone Encounter (Signed)
°  Who's calling (name and relationship to patient) : [phone] Best contact number: [phone] Provider they see: Sharene Skeans Reason for call: Mom wanted help getting disability started for [employer]. Please call before 12pm if possible, mom goes into work then.     PRESCRIPTION REFILL ONLY  Name of prescription:  Pharmacy:

## 2018-04-06 NOTE — Telephone Encounter (Signed)
We spoke for about 4-1/2 minutes.  I told mom that writing a letter would not be helpful.  Rebecca Chandler is already been turned down twice for disability.  The neck step is to hire a lawyer and go through the steps that the lawyer determines will be useful.  The fax of the case are that Rebecca Chandler has intellectual disability and intractable seizures and will not be able to hold gainful employment that supports her.

## 2018-04-06 NOTE — Telephone Encounter (Signed)
°  Who's calling (name and relationship to patient) : Rebecca Chandler, mom  Best contact number: [phone]  Provider they see: Dr. Sharene SkeansHickling  Reason for call: Mom called to follow up about getting [name], says she is off all day today 04/06/18 if Dr. Sharene SkeansHickling is available to reach out to her anytime today to begin this process.     PRESCRIPTION REFILL ONLY  Name of prescription:  Pharmacy:

## 2018-04-06 NOTE — Telephone Encounter (Signed)
Mom is requesting a letter of medical necessity so that the patient can receive disability

## 2018-05-01 ENCOUNTER — Telehealth (INDEPENDENT_AMBULATORY_CARE_PROVIDER_SITE_OTHER): Payer: Self-pay | Admitting: Pediatrics

## 2018-05-01 NOTE — Telephone Encounter (Signed)
°  Who's calling (name and relationship to patient) : Lars Masson - Mom    Best contact number: [phone]  Provider they see: Dr. Sharene Skeans    Reason for call:  Mom called to give a heads up she will be faxing over some disability paper work and forms for an emotional support animal for [employer].    PRESCRIPTION REFILL ONLY  Name of prescription:  Pharmacy:

## 2018-05-01 NOTE — Telephone Encounter (Signed)
°  Who's calling (name and relationship to patient) : Lars Masson - Mother    Best contact number: [phone]  Provider they see: Dr. Sharene Skeans   Reason for call:  Mom called back stating she would like to get [employer] an emotional support animal but does not have any forms or does not know where to start. Please advise    PRESCRIPTION REFILL ONLY  Name of prescription:  Pharmacy:

## 2018-05-01 NOTE — Telephone Encounter (Signed)
Error

## 2018-05-02 NOTE — Telephone Encounter (Signed)
Forms were faxed to our office. They have been placed on Dr. Darl Householder desk

## 2018-05-04 ENCOUNTER — Telehealth (INDEPENDENT_AMBULATORY_CARE_PROVIDER_SITE_OTHER): Payer: Self-pay | Admitting: Pediatrics

## 2018-05-04 NOTE — Telephone Encounter (Signed)
12-minute phone call with mother.  I will be happy to write a letter stating that the dog that Rebecca Chandler has a support animal.  I answered questions concerning her seizures.  I will try to fill out the paperwork for her disability.

## 2018-05-04 NOTE — Telephone Encounter (Signed)
°  Who's calling (name and relationship to patient) : Rebecca Chandler - Mom   Best contact number: [phone]   Provider they see: Dr. Sharene Skeans    Reason for call: Mom called to give [place] the fax # for Jakaya's lawyer for paper work they sent [place]. Mom is in process of becoming her POA  Fax# [phone]   Mom also needs some advice on her seizure paperwork she is filling out for the lawyer. Needs to know if seizures were specific types. Please advise   PRESCRIPTION REFILL ONLY  Name of prescription:  Pharmacy:

## 2018-05-14 ENCOUNTER — Encounter (INDEPENDENT_AMBULATORY_CARE_PROVIDER_SITE_OTHER): Payer: Self-pay | Admitting: Pediatrics

## 2018-05-14 NOTE — Telephone Encounter (Signed)
I have written the letter.  We should mail it to the family home.

## 2018-05-31 ENCOUNTER — Other Ambulatory Visit (INDEPENDENT_AMBULATORY_CARE_PROVIDER_SITE_OTHER): Payer: Self-pay | Admitting: Pediatrics

## 2018-05-31 DIAGNOSIS — G40309 Generalized idiopathic epilepsy and epileptic syndromes, not intractable, without status epilepticus: Secondary | ICD-10-CM

## 2018-05-31 DIAGNOSIS — G40209 Localization-related (focal) (partial) symptomatic epilepsy and epileptic syndromes with complex partial seizures, not intractable, without status epilepticus: Secondary | ICD-10-CM

## 2018-09-04 ENCOUNTER — Other Ambulatory Visit (INDEPENDENT_AMBULATORY_CARE_PROVIDER_SITE_OTHER): Payer: Self-pay | Admitting: Pediatrics

## 2018-09-04 DIAGNOSIS — G40309 Generalized idiopathic epilepsy and epileptic syndromes, not intractable, without status epilepticus: Secondary | ICD-10-CM

## 2018-09-04 DIAGNOSIS — G40209 Localization-related (focal) (partial) symptomatic epilepsy and epileptic syndromes with complex partial seizures, not intractable, without status epilepticus: Secondary | ICD-10-CM

## 2018-10-24 ENCOUNTER — Other Ambulatory Visit (INDEPENDENT_AMBULATORY_CARE_PROVIDER_SITE_OTHER): Payer: Self-pay | Admitting: Pediatrics

## 2018-10-24 DIAGNOSIS — G40209 Localization-related (focal) (partial) symptomatic epilepsy and epileptic syndromes with complex partial seizures, not intractable, without status epilepticus: Secondary | ICD-10-CM

## 2018-10-24 DIAGNOSIS — G40309 Generalized idiopathic epilepsy and epileptic syndromes, not intractable, without status epilepticus: Secondary | ICD-10-CM

## 2018-10-25 ENCOUNTER — Telehealth (INDEPENDENT_AMBULATORY_CARE_PROVIDER_SITE_OTHER): Payer: Self-pay | Admitting: Pediatrics

## 2018-10-25 DIAGNOSIS — G40309 Generalized idiopathic epilepsy and epileptic syndromes, not intractable, without status epilepticus: Secondary | ICD-10-CM

## 2018-10-25 DIAGNOSIS — G40209 Localization-related (focal) (partial) symptomatic epilepsy and epileptic syndromes with complex partial seizures, not intractable, without status epilepticus: Secondary | ICD-10-CM

## 2018-10-25 MED ORDER — DIVALPROEX SODIUM ER 500 MG PO TB24: 1000.0000 mg | ORAL_TABLET | Freq: Every day | ORAL | 5 refills | Status: DC

## 2018-10-25 NOTE — Telephone Encounter (Signed)
Prescription has been electronically refilled as requested

## 2018-10-25 NOTE — Telephone Encounter (Signed)
We received a refill request and I refilled it this morning.

## 2018-10-25 NOTE — Telephone Encounter (Signed)
Who's calling (name and relationship to patient) : Marvis Repress (mom)  Best contact number: [phone]  Provider they see: Dr. Gaynell Face  Reason for call: Mom called in stating that Rebecca Chandler was going to be out of her Depakote in 7 days. Please resend   Call ID:      PRESCRIPTION REFILL ONLY  Name of prescription: Depakote  Pharmacy: [facility]

## 2018-11-17 ENCOUNTER — Other Ambulatory Visit (INDEPENDENT_AMBULATORY_CARE_PROVIDER_SITE_OTHER): Payer: Self-pay | Admitting: Pediatrics

## 2018-11-17 DIAGNOSIS — G40309 Generalized idiopathic epilepsy and epileptic syndromes, not intractable, without status epilepticus: Secondary | ICD-10-CM

## 2018-11-17 DIAGNOSIS — G40209 Localization-related (focal) (partial) symptomatic epilepsy and epileptic syndromes with complex partial seizures, not intractable, without status epilepticus: Secondary | ICD-10-CM

## 2018-11-18 ENCOUNTER — Other Ambulatory Visit (INDEPENDENT_AMBULATORY_CARE_PROVIDER_SITE_OTHER): Payer: Self-pay | Admitting: Pediatrics

## 2018-11-18 DIAGNOSIS — G40309 Generalized idiopathic epilepsy and epileptic syndromes, not intractable, without status epilepticus: Secondary | ICD-10-CM

## 2018-11-18 DIAGNOSIS — G40209 Localization-related (focal) (partial) symptomatic epilepsy and epileptic syndromes with complex partial seizures, not intractable, without status epilepticus: Secondary | ICD-10-CM

## 2019-02-13 ENCOUNTER — Ambulatory Visit (INDEPENDENT_AMBULATORY_CARE_PROVIDER_SITE_OTHER): Payer: Self-pay | Admitting: Pediatrics

## 2019-02-13 ENCOUNTER — Encounter (INDEPENDENT_AMBULATORY_CARE_PROVIDER_SITE_OTHER): Payer: Self-pay | Admitting: Pediatrics

## 2019-02-13 ENCOUNTER — Other Ambulatory Visit: Payer: Self-pay

## 2019-02-13 VITALS — BP 130/80 | HR 84 | Ht 67.0 in | Wt 193.0 lb

## 2019-02-13 DIAGNOSIS — E66811 Obesity, class 1: Secondary | ICD-10-CM

## 2019-02-13 DIAGNOSIS — G40209 Localization-related (focal) (partial) symptomatic epilepsy and epileptic syndromes with complex partial seizures, not intractable, without status epilepticus: Secondary | ICD-10-CM

## 2019-02-13 DIAGNOSIS — Z6830 Body mass index (BMI) 30.0-30.9, adult: Secondary | ICD-10-CM

## 2019-02-13 DIAGNOSIS — E6609 Other obesity due to excess calories: Secondary | ICD-10-CM

## 2019-02-13 DIAGNOSIS — G40309 Generalized idiopathic epilepsy and epileptic syndromes, not intractable, without status epilepticus: Secondary | ICD-10-CM

## 2019-02-13 DIAGNOSIS — Z79899 Other long term (current) drug therapy: Secondary | ICD-10-CM

## 2019-02-13 DIAGNOSIS — E669 Obesity, unspecified: Secondary | ICD-10-CM | POA: Insufficient documentation

## 2019-02-13 DIAGNOSIS — F70 Mild intellectual disabilities: Secondary | ICD-10-CM

## 2019-02-13 DIAGNOSIS — G253 Myoclonus: Secondary | ICD-10-CM

## 2019-02-13 NOTE — Progress Notes (Signed)
Patient: Rebecca Chandler MRN: [id number] Sex: female DOB: 1995/04/21  Provider: Wyline Copas, MD Location of Care: [facility]  Note type: Routine return visit  History of Present Illness: Referral Source: Unk Pinto, MD History from: patient and [facility] chart Chief Complaint: Seizures  Rebecca Chandler is a 22 y.o. female who returns February 13, 2019 for the first time since February 13, 2018.  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  Seizures began at 94 months of age.  Over the course of the last year, she has had a total of 8 seizures.  They last between 6 and 15 minutes in duration.  She has moved to [place] and then back to [place].  Though I had prescribed midazolam 2 shorten her seizures, she did not have it available and did not contact the office.  She is not certain why she has breakthrough seizures.  She feels that it happens when she is too hot.  All the seizures are nocturnal.  Many of them have been witnessed by her boyfriend of 8 years.  Currently she is living with her father but is going to move to [place] to live with her boyfriend.  Her mother is moved back from [place] to [place].  Rebecca Chandler is not working outside the home because of her seizures.  Her boyfriend works between 6 AM and 3 PM.  When she is living with him I think she will not be alone because he is living with his mother and brother.  We were contacted about disability forms and copied notes and send a letter in support of a support dog.  There was no contact concerning her seizures.  One of the most recent seizures was associated with biting her tongue in October.  She asked me about the disability forms and I told her that she would have to direct her inquiry to [place].  I explained to her that the procedure was for them to appoint a a person to  assess her.  Another area of concern that I have is that she has gained 22 pounds in the past year.  This is problematic and continues to trend its been present for some time.  Otherwise in general her health is good.  She goes to bed between 1030 and 11 PM gets up but around 5 or 6 AM when her boyfriend is going to work and then goes back to sleep until 11 AM.  In addition to divalproex and felbamate she also has implanted Nexplanon.  We discussed my concerns about the potential effects of Depakote on an unborn fetus.  She had her stopped birth control in order to have a baby I would want to take her off of Depakote which might exacerbate her seizures.  We also talked about transition to an adult neurologist.  There is an excellent epileptologist, Dr. Ellouise Newer at [facility] who would be able to provide support for Rebecca Chandler for her seizures, but also for issues of reproduction and any other concerns regarding the next visit of epilepsy and women's health.  Finally we talked about increasing her physical activity and beginning to make better choices in terms of what she drinks and the food that she eats to see if we can maintain her weight over the next 6 to 12 months and if possible lose some.  She does not show signs of insulin resistance yet in terms  of acanthosis but I am very concerned about it.  I do not know she continues to have a primary physician.  We have been sending notes to Dr. Lucky Cowboy.  Review of Systems: A complete review of systems was remarkable for patient is here to be seen for seizures. She states that she has had eight seizures in the course of a year. She reports that the seizures last anywhere from six minutes to fifteen minutes. She states that with oe, she bite her tongue. She states that she does not have the Midazolam to stop the seizures that last more than two minutes. She has no other concerns at this time., all other systems reviewed and negative.  Past  Medical History Plan diagnosis Date  . Seizures (HCC)    Hospitalizations: No., Head Injury: No., Nervous System Infections: No., Immunizations up to date: Yes.    Copied from prior chart Rebecca Chandler had onset of seizures when she was [age] months of age. Seizures were myoclonic associated with widening of her eyes. Mother remembers that I used Depakote initially. At some point, she was switched to [facility]. She was able to come off antiepileptic medications and was seizure free from age 84 to 63.  At that time, she had nocturnal arousals with staring, grunting, and sucking sounds with opening and closing her eyelids lasting for five minutes. Her head turned toward the right, these increased from once a month to once a week.  She was sent to [facility] for the first of many EMU evaluations. No events were recorded. She was placed on Tegretol and Keppra. Tegretol was discontinued and Felbatol restarted. In January 2003, she had a 6-day EMU evaluation that showed rare interictal right hemispheric sharp waves, she remained on Keppra and Felbatol.  In December 2009, she had simple and complex partial seizures monthly, typically associated with left-sided jerking and left Todd's paresis, these lasted five to eight minutes and tended to occur between midnight and 5 a.m. She was on Felbatol and Lamictal.   On August 12, 2008, the patient had an EEG that showed frequent high amplitude spike discharges bifrontally that occurred in runs of three to four, this was more prominent over the right than the left hemisphere particularly at Fp2 and F4. She had one ictal event that happened after her medications had been withdrawn. She extended her left leg upward and outward, her head deviated tonically to the right, she vocalized and had tonic stiffening of her entire body, she was cyanotic, she had clonic activity right greater than left. This started in the left parasagittal region, migrated to the left temple  followed by generalized myogenic artifact. This was followed by a rhythmic 2 Hz spike and wave abnormality prominent over the right hemisphere and declined in frequency.  Ictal SPECT showed uptake in the left frontal region. This was believed to be not typical of her seizures. She was sent home on Lamictal and Trileptal, but was unable to tolerate Trileptal and was placed on Felbatol.   The record showed some confusion, but the patient was on the combination of Lamictal and Felbatol when I saw her next on September 20, 2011. She lost a tremendous amount of weight, but unfortunately began to experience myoclonus. Myoclonus was fairly frequent, but generalized seizures were relatively infrequent.  Birth History Term infant to a 5 year old primigravida female  Normal spontaneous vaginal delivery  Nursery course was unremarkable.  Development was normal except for problems with articulation. I believe that she has intellectual  disability in the form of significant learning differences.  Behavior History none  Surgical History History reviewed. No pertinent surgical history.  Family History family history includes Cancer in her paternal grandmother; Lung cancer in her maternal grandfather. Family history is negative for migraines, seizures, intellectual disabilities, blindness, deafness, birth defects, chromosomal disorder, or autism.  Social History Socioeconomic History  . Marital status: Single/boyfriend  . Years of education:  40  . Highest education level:  High school certificate  Occupational History  . Not employed  Social Needs  . Financial resource strain: Not on file  . Food insecurity    Worry: Not on file    Inability: Not on file  . Transportation needs    Medical: Not on file    Non-medical: Not on file  Tobacco Use  . Smoking status: Passive Smoke Exposure - Never Smoker  . Smokeless tobacco: Never Used  . Tobacco comment: Step father smokes   Substance and  Sexual Activity  . Alcohol use: No    Alcohol/week: 0.0 standard drinks  . Drug use: No  . Sexual activity: Not Currently    Birth control/protection: None  Social History Narrative    Rebecca Chandler is a 23 yo woman who has graduated. She lives with her boyfriend. She has 2 brothers. She enjoys sleeping, listening to music and talking to friends. Rebecca Chandler is not currently working or in school.    Allergies Allergen Reactions  . Dust Mite Extract    Physical Exam BP 130/80   Pulse 84   Ht 5\' 7"  (1.702 m)   Wt 193 lb (87.5 kg)   BMI 30.23 kg/m   General: alert, well developed, well nourished, in no acute distress, brown/dyed blonde hair, hazel eyes, right handed Head: normocephalic, no dysmorphic features Ears, Nose and Throat: Otoscopic: tympanic membranes normal; pharynx: oropharynx is pink without exudates or tonsillar hypertrophy Neck: supple, full range of motion, no cranial or cervical bruits Respiratory: auscultation clear Cardiovascular: no murmurs, pulses are normal Musculoskeletal: no skeletal deformities or apparent scoliosis Skin: no rashes or neurocutaneous lesions  Neurologic Exam  Mental Status: alert; oriented to person, place and year; knowledge is normal for age; language is normal Cranial Nerves: visual fields are full to double simultaneous stimuli; extraocular movements are full and conjugate; pupils are round reactive to light; funduscopic examination shows sharp disc margins with normal vessels; symmetric facial strength; midline tongue and uvula; air conduction is greater than bone conduction bilaterally Motor: Normal strength, tone and mass; good fine motor movements; no pronator drift Sensory: intact responses to cold, vibration, proprioception and stereognosis Coordination: good finger-to-nose, rapid repetitive alternating movements and finger apposition Gait and Station: normal gait and station: patient is able to walk on heels, toes and tandem without  difficulty; balance is adequate; Romberg exam is negative; Gower response is negative Reflexes: symmetric and diminished bilaterally; no clonus; bilateral flexor plantar responses  Assessment 1.  Partial epilepsy with impairment of consciousness, not intractable, G40.209. 2.  Generalized convulsive epilepsy, G40.309. 3.  Myoclonus, G25.3. 4.  Mild intellectual disability, F70. 5.  Obesity, E66.9.  Discussion Rebecca Chandler's seizure control is fairly typical of prior years.  There are number of other medications that we can try but I been reluctant to do so because I do not see her all that often, we have not had close communications concerning her seizures, and most recently she moved to to live with her mother.  I expressed to her my concern about her weight and said  that this was becoming a health issue to arrival her seizures.  I think there are probably many factors for it, and it is going be very difficult to deal with.  The issue of obtaining disability is beyond my control.  I know that she had a lawyer to help her with a service dog.  Plan I ordered morning trough blood tests for valproic acid felbamate, and CBC with differential and ALT.  I wrote prescriptions for felbamate and divalproex as well as for Nayzilam which is a new more easily used form of midazolam.  The latter will require a prior authorization.  Rebecca Chandler was not certain of the pharmacy where her family purchase this medication but I was able to find in the last telephone note that it is [facility] in [place].  Greater than 50% of a 40-minute visit was spent in counseling and coordination of care concerning all the issues described above.  She will return to see me in 1 year.  I will be happy to see her sooner based on clinical need.  I told her also that I would be happy to coordinate transfer of care to Dr. Karel JarvisAquino if that was her wish.  She was by herself today.  Her boyfriend and the dog remained in the car.    Medication List   Accurate as of February 13, 2019 11:59 PM. If you have any questions, ask your nurse or doctor.      TAKE these medications   divalproex 500 MG 24 hr tablet Commonly known as: DEPAKOTE ER Take 2 tablets (1,000 mg total) by mouth at bedtime.   felbamate 600 MG tablet Commonly known as: FELBATOL Take 2-1/2 tablets twice daily What changed: See the new instructions. Changed by: Ellison CarwinWilliam Hickling, MD   Nayzilam 5 MG/0.1ML Soln Generic drug: Midazolam Place contents of nasal container inside 1 nostril after 5 minutes of seizure. Started by: Ellison CarwinWilliam Hickling, MD   Nexplanon 68 MG Impl implant Generic drug: etonogestrel Nexplanon 68 mg subdermal implant  Inject by subcutaneous route. Due for removal in 3 years    The medication list was reviewed and reconciled. All changes or newly prescribed medications were explained.  A complete medication list was provided to the patient/caregiver.  Deetta PerlaWilliam H Hickling MD

## 2019-02-13 NOTE — Patient Instructions (Signed)
Thank you for coming today not going to make any changes except for the [facility].

## 2019-02-14 MED ORDER — DIVALPROEX SODIUM ER 500 MG PO TB24: 1000.0000 mg | ORAL_TABLET | Freq: Every day | ORAL | 3 refills | Status: DC

## 2019-02-14 MED ORDER — FELBAMATE 600 MG PO TABS: ORAL_TABLET | ORAL | 3 refills | Status: DC

## 2019-02-14 MED ORDER — NAYZILAM 5 MG/0.1ML NA SOLN: NASAL | 5 refills | Status: DC

## 2019-02-15 ENCOUNTER — Encounter (INDEPENDENT_AMBULATORY_CARE_PROVIDER_SITE_OTHER): Payer: Self-pay

## 2019-02-15 DIAGNOSIS — G40209 Localization-related (focal) (partial) symptomatic epilepsy and epileptic syndromes with complex partial seizures, not intractable, without status epilepticus: Secondary | ICD-10-CM

## 2019-02-15 DIAGNOSIS — G40309 Generalized idiopathic epilepsy and epileptic syndromes, not intractable, without status epilepticus: Secondary | ICD-10-CM

## 2019-02-15 NOTE — Telephone Encounter (Signed)
So are we OK for the scripts that I wrote to [facility]?  Jasalyn said that this was where they got their scripts and it was the last script filled in the chart.

## 2019-02-15 NOTE — Telephone Encounter (Signed)
I went in the chart and added the pharmacy. I did not take [facility] out die to the fact patients change pharmacies all the time

## 2019-02-19 NOTE — Telephone Encounter (Signed)
Please send refills to the [facility] pharmacy

## 2019-02-20 MED ORDER — NAYZILAM 5 MG/0.1ML NA SOLN: NASAL | 5 refills | Status: DC

## 2019-02-20 MED ORDER — DIVALPROEX SODIUM ER 500 MG PO TB24: 1000.0000 mg | ORAL_TABLET | Freq: Every day | ORAL | 3 refills | Status: DC

## 2019-02-20 MED ORDER — FELBAMATE 600 MG PO TABS: ORAL_TABLET | ORAL | 3 refills | Status: DC

## 2019-02-21 ENCOUNTER — Encounter (INDEPENDENT_AMBULATORY_CARE_PROVIDER_SITE_OTHER): Payer: Self-pay

## 2019-03-03 ENCOUNTER — Encounter (INDEPENDENT_AMBULATORY_CARE_PROVIDER_SITE_OTHER): Payer: Self-pay

## 2019-03-06 ENCOUNTER — Other Ambulatory Visit: Payer: Self-pay | Admitting: Pediatrics

## 2019-03-14 LAB — CBC WITH DIFFERENTIAL/PLATELET
Absolute Monocytes: 371 cells/uL (ref 200–950)
Basophils Absolute: 28 cells/uL (ref 0–200)
Basophils Relative: 0.6 %
Eosinophils Absolute: 28 cells/uL (ref 15–500)
Eosinophils Relative: 0.6 %
HCT: 38.5 % (ref 35.0–45.0)
Hemoglobin: 12.8 g/dL (ref 11.7–15.5)
Lymphs Abs: 2341 cells/uL (ref 850–3900)
MCH: 27.6 pg (ref 27.0–33.0)
MCHC: 33.2 g/dL (ref 32.0–36.0)
MCV: 83.2 fL (ref 80.0–100.0)
MPV: 9.9 fL (ref 7.5–12.5)
Monocytes Relative: 7.9 %
Neutro Abs: 1932 cells/uL (ref 1500–7800)
Neutrophils Relative %: 41.1 %
Platelets: 173 10*3/uL (ref 140–400)
RBC: 4.63 10*6/uL (ref 3.80–5.10)
RDW: 13 % (ref 11.0–15.0)
Total Lymphocyte: 49.8 %
WBC: 4.7 10*3/uL (ref 3.8–10.8)

## 2019-03-14 LAB — ALT: ALT: 13 U/L (ref 6–29)

## 2019-03-14 LAB — FELBAMATE LEVEL: Felbamate Lvl: 67 ug/mL — ABNORMAL HIGH

## 2019-03-14 LAB — VALPROIC ACID LEVEL: Valproic Acid Lvl: 106.9 mg/L — ABNORMAL HIGH (ref 50.0–100.0)

## 2019-03-18 ENCOUNTER — Encounter (INDEPENDENT_AMBULATORY_CARE_PROVIDER_SITE_OTHER): Payer: Self-pay

## 2019-03-20 ENCOUNTER — Ambulatory Visit (INDEPENDENT_AMBULATORY_CARE_PROVIDER_SITE_OTHER): Payer: BC Managed Care – PPO | Admitting: Pediatrics

## 2019-03-20 ENCOUNTER — Encounter (INDEPENDENT_AMBULATORY_CARE_PROVIDER_SITE_OTHER): Payer: Self-pay | Admitting: Pediatrics

## 2019-03-20 ENCOUNTER — Other Ambulatory Visit: Payer: Self-pay

## 2019-03-20 VITALS — BP 118/78 | HR 84 | Ht 67.0 in | Wt 191.8 lb

## 2019-03-20 DIAGNOSIS — G253 Myoclonus: Secondary | ICD-10-CM | POA: Diagnosis not present

## 2019-03-20 DIAGNOSIS — F70 Mild intellectual disabilities: Secondary | ICD-10-CM | POA: Diagnosis not present

## 2019-03-20 DIAGNOSIS — G40209 Localization-related (focal) (partial) symptomatic epilepsy and epileptic syndromes with complex partial seizures, not intractable, without status epilepticus: Secondary | ICD-10-CM | POA: Diagnosis not present

## 2019-03-20 DIAGNOSIS — G40309 Generalized idiopathic epilepsy and epileptic syndromes, not intractable, without status epilepticus: Secondary | ICD-10-CM

## 2019-03-20 MED ORDER — PERAMPANEL 2 MG PO TABS: 2.0000 mg | ORAL_TABLET | Freq: Every day | ORAL | 5 refills | Status: DC

## 2019-03-20 NOTE — Progress Notes (Signed)
Patient: Rebecca Chandler MRN: [id number] Sex: female DOB: 18-Apr-1995  Provider: Wyline Copas, MD Location of Care: [facility]  Note type: Routine return visit  History of Present Illness: Referral Source: Rebecca Pinto, MD History from: both parents, patient and Rebecca Chandler chart Chief Complaint: Seizures  Rebecca Chandler is a 24 y.o. female who returns March 20, 2019 for the first time since February 13, 2019.  [name] had onset of seizures at 60 months of age.  She 88 seizures in the course of the year and has had a couple more since that time.  The last one occurred last night.  Rebecca Chandler has no problem with sleep hygiene.  She sometimes does not sleep for more than 7 hours at a time.  She has complained that she becomes overheated and that causes her to have seizures but she notes that she is sweaty and feel what feels warm after she has had her seizure.  Seizures typically occur nocturnally.  The most recent event lasted for 7 or more minutes.  She had unresponsive staring she had urinary incontinence she did not bite her tongue.  Recently blood work was performed and she has a valproic acid level of 102 and a felbamate level of 68, the latter of which is elevated.  I talked to her mother and strongly recommended that we bring her in to discuss possible change in her treatment.  She started off on Depakote when she was 47 months of age.  When that failed, she was switched to felbamate.  She was placed on Tegretol and Keppra after an evaluation at [facility].  She did not tolerate Tegretol which was discontinued.  Felbatol was restarted.  Lamictal then replaced Keppra.  Trileptal was started and discontinued because she could not tolerate it.  Divalproex was reinitiated.  I recommended that she consider the medication [name] because it is a different chemical group than anything else that she is taken.  It is a noncompetitive antagonist of the AMPA  receptor which blocks the effect of glutamate and excitatory neurotransmitter.  His major side effect is sleepiness which is the reason we will start her at 2 mg and gradually increase the dose every 2 weeks.  If this fails, there are other medications that we can try including zonisamide, or topiramate among others.  Her health is good except for her weight.  No other concerns were raised today.  We talked about things that would lower her seizure threshold which includes drinking alcohol and not getting enough sleep.  Review of Systems: A complete review of systems was remarkable for patient is here to be seen for seizures. Patient reports that she had a seizure on January 9th. She states that she is no tsure how long it lasted but mom states all of her seizures last 7 or more minutes. Mom reports that the patient's seizures usually consist of tongue biting and staring off. This seizure consisted of urinating on herself and the gazing. No other concerns at this time, all other systems reviewed and negative.  Past Medical History Diagnosis Date  . Seizures ([place])    Hospitalizations: No., Head Injury: No., Nervous System Infections: No., Immunizations up to date: Yes.    Copied from prior chart Rebecca Chandler had onset of seizures when she was [age] months of age. Seizures were myoclonic associated with widening of her eyes. Mother remembers that I used Depakote initially. At some point, she was switched to [facility]. She was able to come  off antiepileptic medications and was seizure free from age 24 to 84.  At that time, she had nocturnal arousals with staring, grunting, and sucking sounds with opening and closing her eyelids lasting for five minutes. Her head turned toward the right, these increased from once a month to once a week.  She was sent to [facility] for the first of many EMU evaluations. No events were recorded. She was placed on Tegretol and Keppra. Tegretol was discontinued and Felbatol  restarted. In January 2003, she had a 6-day EMU evaluation that showed rare interictal right hemispheric sharp waves, she remained on Keppra and Felbatol.  In December 2009, she had simple and complex partial seizures monthly, typically associated with left-sided jerking and left Todd's paresis, these lasted five to eight minutes and tended to occur between midnight and 5 a.m. She was on Felbatol and Lamictal.   On August 12, 2008, the patient had an EEG that showed frequent high amplitude spike discharges bifrontally that occurred in runs of three to four, this was more prominent over the right than the left hemisphere particularly at Fp2 and F4. She had one ictal event that happened after her medications had been withdrawn. She extended her left leg upward and outward, her head deviated tonically to the right, she vocalized and had tonic stiffening of her entire body, she was cyanotic, she had clonic activity right greater than left. This started in the left parasagittal region, migrated to the left temple followed by generalized myogenic artifact. This was followed by a rhythmic 2 Hz spike and wave abnormality prominent over the right hemisphere and declined in frequency.  Ictal SPECT showed uptake in the left frontal region. This was believed to be not typical of her seizures. She was sent home on Lamictal and Trileptal, but was unable to tolerate Trileptal and was placed on Felbatol.   The record showed some confusion, but the patient was on the combination of Lamictal and Felbatol when I saw her next on September 20, 2011. She lost a tremendous amount of weight, but unfortunately began to experience myoclonus. Myoclonus was fairly frequent, but generalized seizures were relatively infrequent.  Birth History Term infant to a 7 year old primigravida female  Normal spontaneous vaginal delivery  Nursery course was unremarkable.  Development was normal except for problems with articulation. I  believe that she has intellectual disability in the form of significant learning differences.  Behavior History none  Surgical History History reviewed. No pertinent surgical history.  Family History family history includes Cancer in her paternal grandmother; Lung cancer in her maternal grandfather. Family history is negative for migraines, seizures, intellectual disabilities, blindness, deafness, birth defects, chromosomal disorder, or autism.  Social History Socioeconomic History  . Marital status: Single (boyfriend)  . Years of education:  66  . Highest education level:  High school certificate  Occupational History  . Not employed  Tobacco Use  . Smoking status: Passive Smoke Exposure - Never Smoker  . Smokeless tobacco: Never Used  . Tobacco comment: Step father smokes   Substance and Sexual Activity  . Alcohol use: No    Alcohol/week: 0.0 standard drinks  . Drug use: No  . Sexual activity: Not Currently    Birth control/protection: None  Social History Narrative    Rebecca Chandler is a 24 yo woman who has graduated. She lives with her boyfriend. She has 2 brothers. She enjoys sleeping, listening to music and talking to friends. Rebecca Chandler is not currently working or in school.  Allergies Allergen Reactions  . Dust Mite Extract    Physical Exam BP 118/78   Pulse 84   Ht 5\' 7"  (1.702 m)   Wt 191 lb 12.8 oz (87 kg)   BMI 30.04 kg/m   General: alert, well developed, obese, in no acute distress, brown/dyed blonde hair, hazel eyes, right handed Head: normocephalic, no dysmorphic features Ears, Nose and Throat: Otoscopic: tympanic membranes normal; pharynx: oropharynx is pink without exudates or tonsillar hypertrophy Neck: supple, full range of motion, no cranial or cervical bruits Respiratory: auscultation clear Cardiovascular: no murmurs, pulses are normal Musculoskeletal: no skeletal deformities or apparent scoliosis Skin: no rashes or neurocutaneous lesions  Neurologic  Exam  Mental Status: alert; oriented to person; knowledge is below normal for age; language is normal Cranial Nerves: visual fields are full to double simultaneous stimuli; extraocular movements are full and conjugate; pupils are round reactive to light; funduscopic examination shows sharp disc margins with normal vessels; symmetric facial strength; midline tongue and uvula; air conduction is greater than bone conduction bilaterally Motor: normal strength, tone and mass; good fine motor movements; no pronator drift Sensory: intact responses to cold, vibration, proprioception and stereognosis Coordination: good finger-to-nose, rapid repetitive alternating movements and finger apposition Gait and Station: normal gait and station: patient is able to walk on heels, toes and tandem without difficulty; balance is adequate; Romberg exam is negative; Gower response is negative Reflexes: symmetric and diminished bilaterally; no clonus; bilateral flexor plantar responses  Assessment 1.  Focal epilepsy with impairment of consciousness, G40.209. 2.  Generalized convulsive epilepsy, G40.309. 3.  Myoclonus, G25.3. 4.  Mild intellectual disability, F70.  Discussion Rebecca Chandler has failed at least 6 antiepileptic medications, some because she is not tolerated them others because they have not worked.  Felbatol needs to be continued because every time we discontinued it, things get worse and we return to it.  I recommended that we start her on [name] which is a novel drug different from the other medications that have been tried most of which are sodium channel blockers.  This is a potassium channel blocker and has been very effective for generalized seizures.  His main side effect is sleepiness. We will start a very low dose and increase the dose every 2 weeks.  If we can bring seizures under control, divalproex will be discontinued which will be helpful for her because I think that is adding to her problems with  maintaining her weight or losing weight.  In addition it only has to be given once a day and does not require blood testing.  Plan Divalproex and Felbatol will be continued.  [name] will be started.  She will return to see me in 1 month I will see her sooner based on clinical need.  I complemented her on continuing to use MyChart to inform me of her seizures.  Greater than 50% of a 40-minute visit was spent in counseling and coordination of care concerning her intractable seizures and explaining to [name] El her mother and stepfather the benefits and side effects of [name].   Medication List   Accurate as of March 20, 2019 11:59 PM. If you have any questions, ask your nurse or doctor.    divalproex 500 MG 24 hr tablet Commonly known as: DEPAKOTE ER Take 2 tablets (1,000 mg total) by mouth at bedtime.   felbamate 600 MG tablet Commonly known as: FELBATOL Take 2-1/2 tablets twice daily   Nayzilam 5 MG/0.1ML Soln Generic drug: Midazolam Place contents of nasal container  inside 1 nostril after 5 minutes of seizure.   Nexplanon 68 MG Impl implant Generic drug: etonogestrel Nexplanon 68 mg subdermal implant  Inject by subcutaneous route. Due for removal in 3 years   perampanel 2 MG tablet Commonly known as: [name] Take 1 tablet (2 mg total) by mouth at bedtime. Started by: Ellison Carwin, MD    The medication list was reviewed and reconciled. All changes or newly prescribed medications were explained.  A complete medication list was provided to the patient/caregiver.  Deetta Perla MD

## 2019-03-20 NOTE — Patient Instructions (Signed)
We talked about the number of medications that Rebecca Chandler has taken over her life.  This is a novel medication called [name].  Going to have her take it with the divalproex and the Felbatol.  If things are going well, we will get rid of the divalproex for a lot of reasons.  Every time we taken away the [facility] we have regretted it.  Rena put her on a very small dose of the medication at nighttime and see how she tolerates it.  I want to do contact me in 2 weeks if there is no seizures and we will determine whether or not to go up from 2 to 4 mg whether to hold it at 2 mg.

## 2019-03-28 ENCOUNTER — Other Ambulatory Visit (INDEPENDENT_AMBULATORY_CARE_PROVIDER_SITE_OTHER): Payer: Self-pay | Admitting: Pediatrics

## 2019-03-28 DIAGNOSIS — G40309 Generalized idiopathic epilepsy and epileptic syndromes, not intractable, without status epilepticus: Secondary | ICD-10-CM

## 2019-03-28 DIAGNOSIS — G40209 Localization-related (focal) (partial) symptomatic epilepsy and epileptic syndromes with complex partial seizures, not intractable, without status epilepticus: Secondary | ICD-10-CM

## 2019-03-28 NOTE — Telephone Encounter (Signed)
I called her insurance and checked on the status of the PA request for [name]. I was told that it is in progress and that we should receive a fax with notification by tomorrow. TG

## 2019-03-28 NOTE — Telephone Encounter (Signed)
Noted  

## 2019-03-28 NOTE — Telephone Encounter (Signed)
  Who's calling (name and relationship to patient) : Rebecca Chandler, MOM Best contact number:    Provider they see: HICKLING  Reason for call: Mom called to provider new insurance info stating rx needs authorization. [employer] has been verified and added to registration.      PRESCRIPTION REFILL ONLY  Name of prescription:  Pharmacy:

## 2019-03-30 MED ORDER — LACOSAMIDE 50 MG PO TABS: ORAL_TABLET | ORAL | 5 refills | Status: DC

## 2019-03-30 NOTE — Telephone Encounter (Signed)
I received a message that Allyne Gee was denied by insurance. I called the insurance company to ask about an appeal and was told that an appeal could be filed but that she would need to try and fail Vimpat and Topiramate first. TG

## 2019-03-30 NOTE — Telephone Encounter (Signed)
I called mom to inform her that we could not prescribe [name] until Cadence had tried Vimpat and Topamax and failed.  Accordingly I have ordered lacosamide (generic Vimpat)

## 2019-03-30 NOTE — Addendum Note (Signed)
Addended by: Deetta Perla on: 03/30/2019 04:31 PM   Modules accepted: Orders

## 2019-04-18 ENCOUNTER — Ambulatory Visit (INDEPENDENT_AMBULATORY_CARE_PROVIDER_SITE_OTHER): Payer: BC Managed Care – PPO | Admitting: Pediatrics

## 2019-04-18 ENCOUNTER — Encounter (INDEPENDENT_AMBULATORY_CARE_PROVIDER_SITE_OTHER): Payer: Self-pay | Admitting: Pediatrics

## 2019-04-18 ENCOUNTER — Other Ambulatory Visit: Payer: Self-pay

## 2019-04-18 VITALS — BP 104/74 | HR 72 | Ht 67.0 in | Wt 189.4 lb

## 2019-04-18 DIAGNOSIS — G40209 Localization-related (focal) (partial) symptomatic epilepsy and epileptic syndromes with complex partial seizures, not intractable, without status epilepticus: Secondary | ICD-10-CM | POA: Diagnosis not present

## 2019-04-18 DIAGNOSIS — F70 Mild intellectual disabilities: Secondary | ICD-10-CM | POA: Diagnosis not present

## 2019-04-18 DIAGNOSIS — E6609 Other obesity due to excess calories: Secondary | ICD-10-CM

## 2019-04-18 DIAGNOSIS — G40309 Generalized idiopathic epilepsy and epileptic syndromes, not intractable, without status epilepticus: Secondary | ICD-10-CM

## 2019-04-18 DIAGNOSIS — Z6830 Body mass index (BMI) 30.0-30.9, adult: Secondary | ICD-10-CM

## 2019-04-18 MED ORDER — VIMPAT 50 MG PO TABS: ORAL_TABLET | ORAL | 5 refills | Status: DC

## 2019-04-18 NOTE — Progress Notes (Signed)
Patient: Rebecca Chandler MRN: [id number] Sex: female DOB: 03/20/1995  Provider: Ellison Carwin, MD Location of Care: [facility]  Note type: New patient consultation  History of Present Illness: Referral Source: Lucky Cowboy, MD History from: mother, patient and [facility] chart Chief Complaint: Seizures  Rebecca Chandler is a 24 y.o. female who returns for evaluation April 18, 2019 for the first time since March 20, 2019.  She had onset of seizures at 29 months of age and had persistent and increasing seizures over the course of the last year.  She has been treated with Depakote on and off since 34 months of age.  The same is true for felbamate.  She was unable to tolerate Tegretol.  Keppra was ineffective and replaced by Lamictal MIC tube was ineffective and replaced by Trileptal which she could not tolerate.  Her current medications are Vimpat, divalproex, and felbamate.  I started Vimpat after last visit and at a dose of 50 mg twice daily, she had mild drowsiness and no seizures.  It is too soon to know if this is going to work.  The medication makes her sleepy enough at nighttime, that she no longer needs melatonin to fall asleep.  She is sleeping more hours than she used to.  She tends to go to bed sometime between 7 PM and 8 PM.  She has lost 2 pounds since I saw her last.  She says that she is changed none of her activities.  Her general health is good.  No other concerns were raised today.  Review of Systems: A complete review of systems was remarkable for patient is here to be seen for seizures. patient reports that she has not had any seizures since her last visit. She states her only concern is the new medication makes her sleepy when she takes it. She has no other concerns at this time., all other systems reviewed and negative.  Past Medical History Diagnosis Date  . Seizures ([facility])    Hospitalizations: No., Head Injury: No., Nervous System  Infections: No., Immunizations up to date: Yes.    Copied from prior chart Rebecca Chandler had onset of seizures when she was [age] months of age. Seizures were myoclonic associated with widening of her eyes. Mother remembers that I used Depakote initially. At some point, she was switched to [facility]. She was able to come off antiepileptic medications and was seizure free from age 24 to 75.  At that time, she had nocturnal arousals with staring, grunting, and sucking sounds with opening and closing her eyelids lasting for five minutes. Her head turned toward the right, these increased from once a month to once a week.  [facility] for the first of many EMU evaluations. No events were recorded. She was placed on Tegretol and Keppra. Tegretol was discontinued and Felbatol restarted. In January 2003, she had a 6-day EMU evaluation that showed rare interictal right hemispheric sharp waves, she remained on Keppra and Felbatol.  In December 2009, she had simple and complex partial seizures monthly, typically associated with left-sided jerking and left Todd's paresis, these lasted five to eight minutes and tended to occur between midnight and 5 a.m. She was on Felbatol and Lamictal.   On August 12, 2008, the patient had an EEG that showed frequent high amplitude spike discharges bifrontally that occurred in runs of three to four, this was more prominent over the right than the left hemisphere particularly at Fp2 and F4. She had  one ictal event that happened after her medications had been withdrawn. She extended her left leg upward and outward, her head deviated tonically to the right, she vocalized and had tonic stiffening of her entire body, she was cyanotic, she had clonic activity right greater than left. This started in the left parasagittal region, migrated to the left temple followed by generalized myogenic artifact. This was followed by a rhythmic 2 Hz spike and wave abnormality prominent over  the right hemisphere and declined in frequency.  Ictal SPECT showed uptake in the left frontal region. This was believed to be not typical of her seizures. She was sent home on Lamictal and Trileptal, but was unable to tolerate Trileptal and was placed on Felbatol.   The record showed some confusion, but the patient was on the combination of Lamictal and Felbatol when I saw her next on September 20, 2011. She lost a tremendous amount of weight, but unfortunately began to experience myoclonus. Myoclonus was fairly frequent, but generalized seizures were relatively infrequent.  Birth History Term infant to a 68 year old primigravida female  Normal spontaneous vaginal delivery  Nursery course was unremarkable.  Development was normal except for problems with articulation. I believe that she has intellectual disability in the form of significant learning differences.  Behavior History none  Surgical History History reviewed. No pertinent surgical history.  Family History family history includes Cancer in her paternal grandmother; Lung cancer in her maternal grandfather. Family history is negative for migraines, seizures, intellectual disabilities, blindness, deafness, birth defects, chromosomal disorder, or autism.  Social History Socioeconomic History  . Marital status: Single  . Years of education:  16  . Highest education level:  High school certificate  Occupational History  . Not employed  Tobacco Use  . Smoking status: Passive Smoke Exposure - Never Smoker  . Smokeless tobacco: Never Used  . Tobacco comment: Step father smokes   Substance and Sexual Activity  . Alcohol use: No    Alcohol/week: 0.0 standard drinks  . Drug use: No  . Sexual activity: Not Currently    Birth control/protection: None  Social History Narrative    Rebecca Chandler is a 24 yo woman who has graduated. She lives with her boyfriend. She has 2 brothers. She enjoys sleeping, listening to music and talking to  friends. Rebecca Chandler is not currently working or in school.    Allergies Allergen Reactions  . Dust Mite Extract    Physical Exam BP 104/74   Pulse 72   Ht 5\' 7"  (1.702 m)   Wt 189 lb 6.4 oz (85.9 kg)   BMI 29.66 kg/m   General: alert, well developed, obese, in no acute distress, brown/blond highlights hair, hazel eyes, right handed Head: normocephalic, no dysmorphic features Ears, Nose and Throat: Otoscopic: tympanic membranes normal; pharynx: oropharynx is pink without exudates or tonsillar hypertrophy Neck: supple, full range of motion, no cranial or cervical bruits Respiratory: auscultation clear Cardiovascular: no murmurs, pulses are normal Musculoskeletal: no skeletal deformities or apparent scoliosis Skin: no rashes or neurocutaneous lesions  Neurologic Exam  Mental Status: alert; oriented to person, place and year; knowledge is below normal for age; language is normal Cranial Nerves: visual fields are full to double simultaneous stimuli; extraocular movements are full and conjugate; pupils are round reactive to light; funduscopic examination shows sharp disc margins with normal vessels; symmetric facial strength; midline tongue and uvula; air conduction is greater than bone conduction bilaterally Motor: Normal strength, tone and mass; good fine motor movements; no pronator drift  Sensory: intact responses to cold, vibration, proprioception and stereognosis Coordination: good finger-to-nose, rapid repetitive alternating movements and finger apposition Gait and Station: normal gait and station: patient is able to walk on heels, toes and tandem without difficulty; balance is adequate; Romberg exam is negative; Gower response is negative Reflexes: symmetric and diminished bilaterally; no clonus; bilateral flexor plantar responses  Assessment 1.  Generalized convulsive epilepsy, G40.309. 2.  Partial epilepsy with impairment of consciousness, not intractable, G40.209. 3.  Obesity,  E66.9. 4.  Mild intellectual disability, F70.  Discussion I am pleased that seizures are under control at this time of [name].  This is important, but because we are at other times when she has responded for months to years at a time with seizure control, I believe that we need to watch over the next few months before we make any changes in her current treatment regimen.  I am also pleased that she is lost a little weight.  I hope that with the coming of spring, that she gets out to walk.  Her mother provided some disability papers for me to fill out.  I told her that it was the policy of the office not to do that but that we would provide records to any service that she was using.  Plan No change will be made in her Vimpat, divalproex, or felbamate.  She will return to see me in 3 months time.  Greater than 50% of a 25-minute visit was spent in counseling and coordination of care concerning her seizures and their management.   Medication List   Accurate as of April 18, 2019 11:59 PM. If you have any questions, ask your nurse or doctor.    divalproex 500 MG 24 hr tablet Commonly known as: DEPAKOTE ER Take 2 tablets (1,000 mg total) by mouth at bedtime.   felbamate 600 MG tablet Commonly known as: FELBATOL Take 2-1/2 tablets twice daily   Vimpat 50 MG Tabs tablet Generic drug: lacosamide Take 1 tablet twice daily What changed: You were already taking a medication with the same name, and this prescription was added. Make sure you understand how and when to take each. Changed by: Ellison Carwin, MD   Nayzilam 5 MG/0.1ML Soln Generic drug: Midazolam Place contents of nasal container inside 1 nostril after 5 minutes of seizure.   Nexplanon 68 MG Impl implant Generic drug: etonogestrel Nexplanon 68 mg subdermal implant  Inject by subcutaneous route. Due for removal in 3 years    The medication list was reviewed and reconciled. All changes or newly prescribed medications were  explained.  A complete medication list was provided to the patient/caregiver.  Deetta Perla MD

## 2019-04-18 NOTE — Patient Instructions (Addendum)
Thank you for coming today.  I am pleased that Vimpat is working well for her.  This is a very low dose.  I think she will get used to the sleepiness of the medication.  We will see how long this works and how well it works.  If we can continue at this dose, we may be able to taper or discontinue one of the other medications 3 to 6 months from now.  Please continue to get out and get some exercise.  I had like to see you again in 3 months' time.

## 2019-05-11 ENCOUNTER — Encounter (INDEPENDENT_AMBULATORY_CARE_PROVIDER_SITE_OTHER): Payer: Self-pay

## 2019-05-11 DIAGNOSIS — G40309 Generalized idiopathic epilepsy and epileptic syndromes, not intractable, without status epilepticus: Secondary | ICD-10-CM

## 2019-05-11 DIAGNOSIS — G40209 Localization-related (focal) (partial) symptomatic epilepsy and epileptic syndromes with complex partial seizures, not intractable, without status epilepticus: Secondary | ICD-10-CM

## 2019-05-11 MED ORDER — VIMPAT 50 MG PO TABS: ORAL_TABLET | ORAL | 5 refills | Status: DC

## 2019-06-10 ENCOUNTER — Encounter (INDEPENDENT_AMBULATORY_CARE_PROVIDER_SITE_OTHER): Payer: Self-pay

## 2019-06-11 ENCOUNTER — Encounter (INDEPENDENT_AMBULATORY_CARE_PROVIDER_SITE_OTHER): Payer: Self-pay

## 2019-07-02 ENCOUNTER — Telehealth (INDEPENDENT_AMBULATORY_CARE_PROVIDER_SITE_OTHER): Payer: Self-pay | Admitting: Pediatrics

## 2019-07-02 ENCOUNTER — Encounter (INDEPENDENT_AMBULATORY_CARE_PROVIDER_SITE_OTHER): Payer: Self-pay

## 2019-07-02 NOTE — Telephone Encounter (Signed)
I have tried to reach the patient and have been unsuccessful.

## 2019-07-07 ENCOUNTER — Encounter (INDEPENDENT_AMBULATORY_CARE_PROVIDER_SITE_OTHER): Payer: Self-pay

## 2019-07-16 NOTE — Telephone Encounter (Signed)
Patient has an appointment for May 12.

## 2019-07-16 NOTE — Telephone Encounter (Signed)
Patient has an appointment for tomorrow, May 11.

## 2019-07-16 NOTE — Telephone Encounter (Signed)
Patient has an appointment for May 12. 

## 2019-07-18 ENCOUNTER — Ambulatory Visit (INDEPENDENT_AMBULATORY_CARE_PROVIDER_SITE_OTHER): Payer: BC Managed Care – PPO | Admitting: Pediatrics

## 2019-07-18 ENCOUNTER — Encounter (INDEPENDENT_AMBULATORY_CARE_PROVIDER_SITE_OTHER): Payer: Self-pay | Admitting: Pediatrics

## 2019-07-18 ENCOUNTER — Other Ambulatory Visit: Payer: Self-pay

## 2019-07-18 VITALS — BP 110/70 | HR 76 | Ht 67.0 in | Wt 186.2 lb

## 2019-07-18 DIAGNOSIS — F70 Mild intellectual disabilities: Secondary | ICD-10-CM

## 2019-07-18 DIAGNOSIS — G40219 Localization-related (focal) (partial) symptomatic epilepsy and epileptic syndromes with complex partial seizures, intractable, without status epilepticus: Secondary | ICD-10-CM

## 2019-07-18 DIAGNOSIS — G253 Myoclonus: Secondary | ICD-10-CM

## 2019-07-18 DIAGNOSIS — G40319 Generalized idiopathic epilepsy and epileptic syndromes, intractable, without status epilepticus: Secondary | ICD-10-CM

## 2019-07-18 MED ORDER — FYCOMPA 2 MG PO TABS: ORAL_TABLET | ORAL | 5 refills | Status: DC

## 2019-07-18 NOTE — Progress Notes (Signed)
Patient: Rebecca Chandler MRN: [id number] Sex: female DOB: Aug 19, 1995  Provider: Ellison Carwin, MD Location of Care: [facility] Child Neurology  Note type: Routine return visit  History of Present Illness: Referral Source: Lucky Cowboy, MD History from: mother, patient and [facility] chart Chief Complaint: Seizures  Rebecca Chandler is a 24 y.o. female who was evaluated Jul 18, 2019 for the first time since April 18, 2019.  She had onset of seizures at 101 months of age which have rarely been in control despite multiple medical regimens.  Seizures have worsened in the past year despite attempts to optimize medications and add new ones.  She was treated with Depakote and felbamate on and off beginning at 47 months of age.  She could not tolerate Tegretol.  Keppra was ineffective and replaced by Lamictal which was ineffective.  This was replaced by Trileptal which she could not tolerate.  We recently started Vimpat which has made her very drowsy at a dose of 50 mg twice daily.  It is unlikely that we will be able to go higher.  Despite this, she has experienced early morning seizures with tongue biting and biting her lip on March 5, April 4, April 26, and May 1.  I have tried repeatedly to engage her through My Chart and to call her and her mother but the phone is never answered and I am unable to leave messages.  Rebecca Chandler has mild intellectual disability.  Unless she is not under guardianship.  This is a decision only the family can make.  Nonetheless I want to make certain that she signed a release so that I can talk to her mother and I made it clear to her mother that she has to have a means for me to communicate with her efficiently so that we can deal with these issues as they arise.  We may have to work out some way that mother should be granted a proxy and I do not know if that is going to be possible.  It is clear that Vimpat is not working, but because she is not able  to tolerate it like so many other medications, I think we need to move on.  I tried to prescribe [name] in January, but we were told that I could not prescribe it.  Need to redouble our efforts given the intractability of her seizures and the usefulness of this medication.  All of her seizures appear to be nocturnal.  I would suspect that all of them are generalized.  She has a boyfriend, but I hate Dr. Sharene Skeans do not know if really will be sure I have not done that before now now given that that I I made a list we can now he is sleeping through them.  He does not come to office visits.  In general her health is good.  She has lost 3 more pounds.  This is part of the trend over the last few months which is very good because she had gained a lot of weight in the past.  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  Her EEG has shown focal abnormalities which is why we have tried focal drugs but she is not been able to tolerate any of them and they have not worked.  Review of Systems: A complete review of systems was remarkable for patient is here to be seen for seizures. patient reports that she had three seizures since her last visit.  She states that she has been biting her tongue in her sleep. She reports no other concerns at this time., all other systems reviewed and negative.  Past Medical History Diagnosis Date  . Seizures (HCC)    Hospitalizations: No., Head Injury: No., Nervous System Infections: No., Immunizations up to date: Yes.    Copied from prior chart Domingue had onset of seizures when she was [age] months of age. Seizures were myoclonic associated with widening of her eyes. Mother remembers that I used Depakote initially. At some point, she was switched to [facility]. She was able to come off antiepileptic medications and was seizure free from age 51 to 42.  At that time, she had nocturnal arousals with staring, grunting, and sucking sounds with opening  and closing her eyelids lasting for five minutes. Her head turned toward the right, these increased from once a month to once a week.  [facility]was [facility] to [facility] for the first of many EMU evaluations. No events were recorded. She was placed on Tegretol and Keppra. Tegretol was discontinued and Felbatol restarted. In January 2003, she had a 6-day EMU evaluation that showed rare interictal right hemispheric sharp waves, she remained on Keppra and Felbatol.  In December 2009, she had simple and complex partial seizures monthly, typically associated with left-sided jerking and left Todd's paresis, these lasted five to eight minutes and tended to occur between midnight and 5 a.m. She was on Felbatol and Lamictal.   On August 12, 2008, the patient had an EEG that showed frequent high amplitude spike discharges bifrontally that occurred in runs of three to four, this was more prominent over the right than the left hemisphere particularly at Fp2 and F4. She had one ictal event that happened after her medications had been withdrawn. She extended her left leg upward and outward, her head deviated tonically to the right, she vocalized and had tonic stiffening of her entire body, she was cyanotic, she had clonic activity right greater than left. This started in the left parasagittal region, migrated to the left temple followed by generalized myogenic artifact. This was followed by a rhythmic 2 Hz spike and wave abnormality prominent over the right hemisphere and declined in frequency.  Ictal SPECT showed uptake in the left frontal region. This was believed to be not typical of her seizures. She was sent home on Lamictal and Trileptal, but was unable to tolerate Trileptal and was placed on Felbatol.   The record showed some confusion, but the patient was on the combination of Lamictal and Felbatol when I saw her next on September 20, 2011. She lost a tremendous amount of weight, but unfortunately began to experience  myoclonus. Myoclonus was fairly frequent, but generalized seizures were relatively infrequent.  Birth History Term infant to a 22 year old primigravida female  Normal spontaneous vaginal delivery  Nursery course was unremarkable.  Development was normal except for problems with articulation. I believe that she has intellectual disability in the form of significant learning differences.  Behavior History none  Surgical History History reviewed. No pertinent surgical history.  Family History family history includes Cancer in her paternal grandmother; Lung cancer in her maternal grandfather. Family history is negative for migraines, seizures, intellectual disabilities, blindness, deafness, birth defects, chromosomal disorder, or autism.  Social History Socioeconomic History  . Number of children: Not on file  . Years of education:  72  . Highest education level:  High school certificate  Occupational History  . Not employed  Tobacco Use  .  Smoking status: Passive Smoke Exposure - Never Smoker  . Smokeless tobacco: Never Used  . Tobacco comment: Step father smokes   Substance and Sexual Activity  . Alcohol use: No    Alcohol/week: 0.0 standard drinks  . Drug use: No  . Sexual activity: Not Currently    Birth control/protection: None  Social History Narrative    Rebecca Chandler is a 24 yo woman who has graduated. She lives with her boyfriend. She has 2 brothers. She enjoys sleeping, listening to music and talking to friends. Lanisha is not currently working or in school.    Allergies Allergen Reactions  . Dust Mite Extract    Physical Exam BP 110/70   Pulse 76   Ht 5\' 7"  (1.702 m)   Wt 186 lb 3.2 oz (84.5 kg)   BMI 29.16 kg/m   General: alert, well developed, well nourished, in no acute distress, blond/brown, dyed hair, hazel eyes, right handed Head: normocephalic, no dysmorphic features Ears, Nose and Throat: Otoscopic: tympanic membranes normal; pharynx: oropharynx is  pink without exudates or tonsillar hypertrophy; healing tongue Neck: supple, full range of motion, no cranial or cervical bruits Respiratory: auscultation clear Cardiovascular: no murmurs, pulses are normal Musculoskeletal: no skeletal deformities or apparent scoliosis Skin: no rashes or neurocutaneous lesions  Neurologic Exam  Mental Status: alert; oriented to person, place and year; knowledge is normal for age; language is normal Cranial Nerves: visual fields are full to double simultaneous stimuli; extraocular movements are full and conjugate; pupils are round reactive to light; funduscopic examination shows sharp disc margins with normal vessels; symmetric facial strength; midline tongue and uvula; air conduction is greater than bone conduction bilaterally Motor: Normal strength, tone and mass; good fine motor movements; no pronator drift Sensory: intact responses to cold, vibration, proprioception and stereognosis Coordination: good finger-to-nose, rapid repetitive alternating movements and finger apposition Gait and Station: normal gait and station: patient is able to walk on heels, toes and tandem without difficulty; balance is adequate; Romberg exam is negative; Gower response is negative Reflexes: symmetric and diminished bilaterally; no clonus; bilateral flexor plantar responses  Assessment 1.  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 2.  Partial epilepsy with impairment of consciousness, intractable, G40.219. 3.  Myoclonus, G25.3 4.  Mild intellectual disability, F70.  Discussion I had a long discussion with Rebecca Chandler and her mother, Rebecca Chandler.  Certainly have better communication in the sense that I am aware of her seizure frequency, but we have to be able to adjust her medications in between visits.  I am not certain why [name] was not accepted by her insurer when she has failed multiple medications.  I will discuss this with my nurse practitioner to see  if we can prevail this time.  Plan [name] was ordered at 2 mg at nighttime.  I am not going to change the other medicines for now.  In all likelihood I will drop Vimpat.  She will return to see me in 2 months.  I expect to work out a better system communication with the family.  If we fail this, I think that she needs to go to tertiary care center for a second opinion.  Greater than 50% of a 25-minute visit was spent counseling and coordination of care.  In particular the centers on her in uncontrolled epilepsy and the difficulty that we had with communication which is noted above.   Medication List   Accurate as of Jul 18, 2019 11:59 PM. If you have any questions, ask your  nurse or doctor.      TAKE these medications   divalproex 500 MG 24 hr tablet Commonly known as: DEPAKOTE ER Take 2 tablets (1,000 mg total) by mouth at bedtime.   felbamate 600 MG tablet Commonly known as: FELBATOL Take 2-1/2 tablets twice daily   [name] 2 MG tablet Generic drug: perampanel Take 1 tablet at nighttime Started by: Wyline Copas, MD   lacosamide 50 MG Tabs tablet Commonly known as: VIMPAT Take 1 tablet twice daily   Vimpat 50 MG Tabs tablet Generic drug: lacosamide Take 1 tablet in the morning and 2 at nighttime   Nexplanon 68 MG Impl implant Generic drug: etonogestrel Nexplanon 68 mg subdermal implant  Inject by subcutaneous route. Due for removal in 3 years    The medication list was reviewed and reconciled. All changes or newly prescribed medications were explained.  A complete medication list was provided to the patient/caregiver.  Jodi Geralds MD

## 2019-07-18 NOTE — Patient Instructions (Signed)
Thank you for coming today.  We have to work on her communication and I know you are going to do that.  I am going to try to get [name] accepted so that we can started.  For now I would make no change in her current treatments.  I would like to see Rebecca Chandler in 2 months.

## 2019-07-19 ENCOUNTER — Telehealth (INDEPENDENT_AMBULATORY_CARE_PROVIDER_SITE_OTHER): Payer: Self-pay | Admitting: Pediatrics

## 2019-07-19 NOTE — Telephone Encounter (Signed)
I called mother and told her that we need to speak.  I would like to discontinue Vimpat and place her on topiramate.  If we do that, it is possible that we could actually get her to [facility].  The other option is to go through patient assistance with [employer].

## 2019-07-23 ENCOUNTER — Encounter (INDEPENDENT_AMBULATORY_CARE_PROVIDER_SITE_OTHER): Payer: Self-pay

## 2019-07-28 ENCOUNTER — Encounter (INDEPENDENT_AMBULATORY_CARE_PROVIDER_SITE_OTHER): Payer: Self-pay

## 2019-08-19 ENCOUNTER — Encounter (INDEPENDENT_AMBULATORY_CARE_PROVIDER_SITE_OTHER): Payer: Self-pay

## 2019-09-20 ENCOUNTER — Other Ambulatory Visit: Payer: Self-pay

## 2019-09-20 ENCOUNTER — Ambulatory Visit (INDEPENDENT_AMBULATORY_CARE_PROVIDER_SITE_OTHER): Payer: BC Managed Care – PPO | Admitting: Pediatrics

## 2019-09-20 ENCOUNTER — Encounter (INDEPENDENT_AMBULATORY_CARE_PROVIDER_SITE_OTHER): Payer: Self-pay | Admitting: Pediatrics

## 2019-09-20 VITALS — BP 130/78 | HR 84 | Ht 67.0 in | Wt 197.2 lb

## 2019-09-20 DIAGNOSIS — G40319 Generalized idiopathic epilepsy and epileptic syndromes, intractable, without status epilepticus: Secondary | ICD-10-CM

## 2019-09-20 DIAGNOSIS — G253 Myoclonus: Secondary | ICD-10-CM | POA: Diagnosis not present

## 2019-09-20 DIAGNOSIS — F70 Mild intellectual disabilities: Secondary | ICD-10-CM | POA: Diagnosis not present

## 2019-09-20 DIAGNOSIS — G40219 Localization-related (focal) (partial) symptomatic epilepsy and epileptic syndromes with complex partial seizures, intractable, without status epilepticus: Secondary | ICD-10-CM

## 2019-09-20 NOTE — Patient Instructions (Signed)
Thank you for coming today.  I am glad that she is not having any seizures.  We will slowly taper and discontinue Vimpat.  Drop it to 1 tablet at nighttime for a week and then discontinue it.  Please let me know if there are any breakthrough seizures.  I like to see you again in 4 months.  Would like to see you both get immunized for Covid but I realize that you are not able to commit to that at this time.  Please let me know if there is anything else I can do between now and when I see you.

## 2019-09-20 NOTE — Progress Notes (Signed)
Patient: Rebecca Chandler MRN: [id number] Sex: female DOB: 11-09-1995  Provider: Ellison Carwin, MD Location of Care: [facility] Child Neurology  Note type: Routine return visit  History of Present Illness: Referral Source: Rebecca Cowboy, MD History from: mother, patient and [facility] chart Chief Complaint: Seizures  Rebecca Chandler is a 24 y.o. female who was evaluated September 20, 2019 for the first time since Jul 18, 2019.  She had onset of seizures at 40 months of age which have really been in control despite multiple medical regimens.  Her history of medication use is described in the me 02/25/2020 note.  She had a series of nocturnal generalized tonic-clonic seizures also described in that note.  As result of this, I recommended placing her on [name] and low-dose with the intent to increase the dose if it was not working.  It has worked beyond anything I could have imagined.  She has been seizure-free since starting the medication.  The only problems that she has now is occasional dizziness that comes and goes after her morning dose and I suspect is related to polypharmacy (probably a drug drug interaction with Vimpat).  She has moved back with her mother and is now going to bed around 10 PM to 11 PM and sleeping until 10:30 AM.  She often is awakened by her mother to take her medication and then may go back to sleep.  After several visits of slight weight loss she has gained 11 pounds since I saw her in May which concerns me greatly.  We have to work on trying to increase her physical activity and alter her diet so that she can maintain her weight.  I challenged her to do that.  Her general health is good.  Neither she nor her mother have contracted Covid.  Neither have been immunized.  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  It is nonspecific.  Review of Systems: A  complete review of systems was remarkable for patient is here to be seen for seizures. She reports that she has not had any seizures since her last visit. She states that she has had no headaches as well. She has no concerns at this time,, all other systems reviewed and negative.  Past Medical History Diagnosis Date  . Seizures (HCC)    Hospitalizations: No., Head Injury: No., Nervous System Infections: No., Immunizations up to date: Yes.    Copied from prior chart note Rebecca Chandler had onset of seizures when she was [age] months of age. Seizures were myoclonic associated with widening of her eyes. Mother remembers that I used Depakote initially. At some point, she was switched to [facility]. She was able to come off antiepileptic medications and was seizure free from age 69 to 37.  At that time, she had nocturnal arousals with staring, grunting, and sucking sounds with opening and closing her eyelids lasting for five minutes. Her head turned toward the right, these increased from once a month to once a week.  [facility] for the first of many EMU evaluations. No events were recorded. She was placed on Tegretol and Keppra. Tegretol was discontinued and Felbatol restarted. In January 2003, she had a 6-day EMU evaluation that showed rare interictal right hemispheric sharp waves, she remained on Keppra and Felbatol.  In December 2009, she had simple and complex partial seizures monthly, typically associated with left-sided jerking and left Todd's  paresis, these lasted five to eight minutes and tended to occur between midnight and 5 a.m. She was on Felbatol and Lamictal.   On August 12, 2008, the patient had an EEG that showed frequent high amplitude spike discharges bifrontally that occurred in runs of three to four, this was more prominent over the right than the left hemisphere particularly at Fp2 and F4. She had one ictal event that happened after her medications had been withdrawn. She  extended her left leg upward and outward, her head deviated tonically to the right, she vocalized and had tonic stiffening of her entire body, she was cyanotic, she had clonic activity right greater than left. This started in the left parasagittal region, migrated to the left temple followed by generalized myogenic artifact. This was followed by a rhythmic 2 Hz spike and wave abnormality prominent over the right hemisphere and declined in frequency.  Ictal SPECT showed uptake in the left frontal region. This was believed to be not typical of her seizures. She was sent home on Lamictal and Trileptal, but was unable to tolerate Trileptal and was placed on Felbatol.   The record showed some confusion, but the patient was on the combination of Lamictal and Felbatol when I saw her next on September 20, 2011. She lost a tremendous amount of weight, but unfortunately began to experience myoclonus. Myoclonus was fairly frequent, but generalized seizures were relatively infrequent.  She was treated with Depakote and felbamate on and off beginning at 62 months of age.  She could not tolerate Tegretol.  Keppra was ineffective and replaced by Lamictal which was ineffective.  This was replaced by Trileptal which she could not tolerate.  We recently started Vimpat which has made her very drowsy at a dose of 50 mg twice daily.  Birth History Term infant to a 18 year old primigravida female  Normal spontaneous vaginal delivery  Nursery course was unremarkable.  Development was normal except for problems with articulation. I believe that she has intellectual disability in the form of significant learning differences.  Behavior History none  Surgical History History reviewed. No pertinent surgical history.  Family History family history includes Cancer in her paternal grandmother; Lung cancer in her maternal grandfather. Family history is negative for migraines, seizures, intellectual disabilities,  blindness, deafness, birth defects, chromosomal disorder, or autism.  Social History Socioeconomic History  . Marital status: Single  . Years of education:  49  . Highest education level:  High school certificate  Occupational History  . Not employed  Tobacco Use  . Smoking status: Passive Smoke Exposure - Never Smoker  . Smokeless tobacco: Never Used  . Tobacco comment: Step father smokes   Substance and Sexual Activity  . Alcohol use: No    Alcohol/week: 0.0 standard drinks  . Drug use: No  . Sexual activity: Not Currently    Birth control/protection: None  Social History Narrative    Tarina is a 24 yo woman who has graduated from high school. She has a boyfriend but over the past couple of months has lived with her mother. She has 2 brothers. She enjoys sleeping, listening to music and talking to friends. Jhanae is not currently working or in school.    Allergies Allergen Reactions  . Dust Mite Extract    Physical Exam BP 130/78   Pulse 84   Ht 5\' 7"  (1.702 m)   Wt 197 lb 3.2 oz (89.4 kg)   BMI 30.89 kg/m   General: alert, well developed, well nourished, in  no acute distress, blond, brown, dyed hair, hazel eyes, right handed Head: normocephalic, no dysmorphic features Ears, Nose and Throat: Otoscopic: tympanic membranes normal; pharynx: oropharynx is pink without exudates or tonsillar hypertrophy Neck: supple, full range of motion, no cranial or cervical bruits Respiratory: auscultation clear Cardiovascular: no murmurs, pulses are normal Musculoskeletal: no skeletal deformities or apparent scoliosis Skin: no rashes or neurocutaneous lesions  Neurologic Exam  Mental Status: alert; oriented to person; knowledge is below normal for age; language is normal Cranial Nerves: visual fields are full to double simultaneous stimuli; extraocular movements are full and conjugate; pupils are round reactive to light; funduscopic examination shows sharp disc margins with normal  vessels; symmetric facial strength; midline tongue and uvula; air conduction is greater than bone conduction bilaterally Motor: Normal strength, tone and mass; good fine motor movements; no pronator drift Sensory: intact responses to cold, vibration, proprioception and stereognosis Coordination: good finger-to-nose, rapid repetitive alternating movements and finger apposition Gait and Station: normal gait and station: patient is able to walk on heels, toes and tandem without difficulty; balance is adequate; Romberg exam is negative; Gower response is negative Reflexes: symmetric and diminished bilaterally; no clonus; bilateral flexor plantar responses  Assessment 1.  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 2.  Partial epilepsy with impairment of consciousness, intractable, G40.219. 3.  Myoclonus, G25.3. 4.  Mild intellectual disability, F70.  Discussion I am extremely pleased that Brittany's seizures are under control for now.  This is happened before so it remains to be seen if that will continue.  We will continue her on all her medications except for Vimpat which will be slowly tapered to 1 tablet at nighttime for a week and then discontinued.  We will observe her.  If she has breakthrough seizures [name] will be increased, Vimpat will not be restarted.  My goal is to try to simplify her regimen if it is possible to do so.  Plan She will return to see me in 4 months.  I will see her sooner based on clinical need.  Greater than 50% of a 25-minute visit was spent in counseling and coordination of care, discussing Covid vaccine with her mother and Addysin.   Medication List   Accurate as of September 20, 2019  9:10 AM. If you have any questions, ask your nurse or doctor.    divalproex 500 MG 24 hr tablet Commonly known as: DEPAKOTE ER Take 2 tablets (1,000 mg total) by mouth at bedtime.   felbamate 600 MG tablet Commonly known as: FELBATOL Take 2-1/2 tablets twice  daily   [name] 2 MG tablet Generic drug: perampanel Take 1 tablet at nighttime   Nexplanon 68 MG Impl implant Generic drug: etonogestrel Nexplanon 68 mg subdermal implant  Inject by subcutaneous route. Due for removal in 3 years    The medication list was reviewed and reconciled. All changes or newly prescribed medications were explained.  A complete medication list was provided to the patient/caregiver.  Deetta Perla MD

## 2019-09-25 ENCOUNTER — Ambulatory Visit (INDEPENDENT_AMBULATORY_CARE_PROVIDER_SITE_OTHER): Payer: BC Managed Care – PPO | Admitting: Neurology

## 2019-10-28 ENCOUNTER — Encounter (INDEPENDENT_AMBULATORY_CARE_PROVIDER_SITE_OTHER): Payer: Self-pay

## 2019-12-05 ENCOUNTER — Encounter (INDEPENDENT_AMBULATORY_CARE_PROVIDER_SITE_OTHER): Payer: Self-pay

## 2019-12-05 ENCOUNTER — Telehealth (INDEPENDENT_AMBULATORY_CARE_PROVIDER_SITE_OTHER): Payer: Self-pay | Admitting: Pediatrics

## 2019-12-05 NOTE — Telephone Encounter (Signed)
°  Who's calling (name and relationship to patient) : [name] (mom)  Best contact number: [phone]  Provider they see: Dr. Sharene Skeans  Reason for call: Patient had a seizure this morning lasting approximately 7 minutes. Mom will upload the video into MyChart later today for the doctor to see the seizure. She says there are new characteristics to the seizures. Reports that patient did not lose consciousness during the seizure and was aware that she was having a seizure. Mom feels that new medication is working.    PRESCRIPTION REFILL ONLY  Name of prescription:  Pharmacy:

## 2019-12-05 NOTE — Telephone Encounter (Signed)
Sent phone call to on-call provider

## 2019-12-11 ENCOUNTER — Telehealth (INDEPENDENT_AMBULATORY_CARE_PROVIDER_SITE_OTHER): Payer: Self-pay | Admitting: Pediatrics

## 2019-12-11 NOTE — Telephone Encounter (Signed)
Who's calling (name and relationship to patient) : Lars Masson mom   Best contact number: [phone]  Provider they see: Dr. Sharene Skeans  Reason for call: Mom tried to use mychart and couldn't get it to work. So mom was instructed to email info to [email or web address]. She sent a video and request about a referral to this email and wanted to know if Dr. Sharene Skeans had seen it.   Call ID:      PRESCRIPTION REFILL ONLY  Name of prescription:  Pharmacy:

## 2019-12-11 NOTE — Telephone Encounter (Signed)
I left a message with [name] about the video that she made that sitting in my email I reviewed it and believe that it is nonepileptic.  I do not know if there are other seizures that preceded that that or not on the video.  I told her that I would call back tomorrow.  We also will have to consider tertiary care centers who would provide care to older women with adrenoleukomyeloneuropathy.

## 2019-12-11 NOTE — Telephone Encounter (Signed)
I checked the [facility] e-mail for these items. The e-mails were not received. I called and spoke to patient's mother to let her know. She stated they were not having issues with MyChart, but because the video was lengthy it would not download to MyChart which was why she e-mailed it. I invited mother to send these e-mails to my [facility] e-mail address. I have now received these e-mails and forwarded them to Dr. Sharene Skeans for review. Barrington Ellison

## 2019-12-14 ENCOUNTER — Other Ambulatory Visit (INDEPENDENT_AMBULATORY_CARE_PROVIDER_SITE_OTHER): Payer: Self-pay | Admitting: Pediatrics

## 2019-12-14 DIAGNOSIS — G40319 Generalized idiopathic epilepsy and epileptic syndromes, intractable, without status epilepticus: Secondary | ICD-10-CM

## 2019-12-19 NOTE — Telephone Encounter (Signed)
I will not be able to take care of this for a week while I am out of the office.  As best I know mother has not called back.

## 2020-01-11 ENCOUNTER — Telehealth (INDEPENDENT_AMBULATORY_CARE_PROVIDER_SITE_OTHER): Payer: BC Managed Care – PPO | Admitting: Pediatrics

## 2020-01-11 ENCOUNTER — Encounter (INDEPENDENT_AMBULATORY_CARE_PROVIDER_SITE_OTHER): Payer: Self-pay | Admitting: Pediatrics

## 2020-01-11 ENCOUNTER — Other Ambulatory Visit: Payer: Self-pay

## 2020-01-11 VITALS — Wt 200.0 lb

## 2020-01-11 DIAGNOSIS — G40319 Generalized idiopathic epilepsy and epileptic syndromes, intractable, without status epilepticus: Secondary | ICD-10-CM

## 2020-01-11 DIAGNOSIS — G40309 Generalized idiopathic epilepsy and epileptic syndromes, not intractable, without status epilepticus: Secondary | ICD-10-CM

## 2020-01-11 DIAGNOSIS — F70 Mild intellectual disabilities: Secondary | ICD-10-CM

## 2020-01-11 DIAGNOSIS — G40209 Localization-related (focal) (partial) symptomatic epilepsy and epileptic syndromes with complex partial seizures, not intractable, without status epilepticus: Secondary | ICD-10-CM | POA: Diagnosis not present

## 2020-01-11 DIAGNOSIS — G40219 Localization-related (focal) (partial) symptomatic epilepsy and epileptic syndromes with complex partial seizures, intractable, without status epilepticus: Secondary | ICD-10-CM | POA: Diagnosis not present

## 2020-01-11 MED ORDER — FELBAMATE 600 MG PO TABS: ORAL_TABLET | ORAL | 3 refills | Status: DC

## 2020-01-11 MED ORDER — DIVALPROEX SODIUM ER 500 MG PO TB24: 1000.0000 mg | ORAL_TABLET | Freq: Every day | ORAL | 3 refills | Status: DC

## 2020-01-11 NOTE — Progress Notes (Signed)
This is a Pediatric Specialist E-Visit follow up consult provided via [facility] Rebecca Chandler and their parent/guardian Rebecca Chandler consented to an E-Visit consult today.  Location of patient: Rebecca Chandler is at home Location of provider: Jack [facility] is at home Patient was referred by Lucky Cowboy, MD   The following participants were involved in this E-Visit: Rebecca Chandler, Dr. Sharene Skeans, Rebecca Chandler, CMA  Chief Complaint/ Reason for E-Visit today: Epilepsy Total time on call: 20 minutes Follow up: 4 months    Patient: Rebecca Chandler MRN: [id number] Sex: female DOB: 1995-05-13  Provider: Ellison Carwin, MD Location of Care: [facility]  Note type: Routine return visit  History of Present Illness: Referral Source: Lucky Cowboy, MD History from: mother, patient and [facility] chart Chief Complaint: Seizures  Rebecca Chandler is a 24 y.o. female who was evaluated January 11, 2020 virtually for the first time since September 20, 2019.  She had onset of seizures at 65 months of age which have rarely been in control.  History of medication use was described in the Jul 18, 2019 note.  At that time we took her off Vimpat and placed her on [name].  This is worked extremely well.  She takes and tolerates all 3 of her medications without side effects.  She is overweight.  I hope that if we can keep her seizures under control that we can simplify her regimen but more importantly that she can become more independent.  At this moment she lives with her mother.  She is having a break-up with a long-term boyfriend which has been upsetting.  Her mother is not having her do very much at home which I think is a mistake.  She is not working outside the home because of her seizures.  Her general health is good.  She goes to bed between 10 PM and 10:30 PM, sometimes much later.  She typically sleeps 10 or 11 hours.  No one has contracted Covid.  Mother's been  vaccinated.  Rebecca Chandler is not.  I talked to her about why she should.  Review of Systems: A complete review of systems was remarkable for patient is here to be seen for epilepsy. Mom states that the patient only had one seizure since her last visit. She reports that she notified Dr. Sharene Skeans but he states that he did not think that it was a seizure. She states that it started out with stiffened legs and jerking. Mom states that is lasted seven minutes. She states that she did not go to the [facility] and did not sleep after. She reports no other seizures or concerns at this time., all other systems reviewed and negative.  Past Medical History Diagnosis Date  . Seizures (HCC)    Hospitalizations: Yes.  , Head Injury: No., Nervous System Infections: No., Immunizations up to date: Yes.    Copied from prior chart notes Rebecca Chandler had onset of seizures when she was [age] months of age. Seizures were myoclonic associated with widening of her eyes. Mother remembers that I used Depakote initially. At some point, she was switched to Rebecca Chandler. She was able to come off antiepileptic medications and was seizure free from age 67 to 39.  At that time, she had nocturnal arousals with staring, grunting, and sucking sounds with opening and closing her eyelids lasting for five minutes. Her head turned toward the right, these increased from once a month to once a week.  [facility] for the first of many  EMU evaluations. No events were recorded. She was placed on Tegretol and Keppra. Tegretol was discontinued and Felbatol restarted. In January 2003, she had a 6-day EMU evaluation that showed rare interictal right hemispheric sharp waves, she remained on Keppra and Felbatol.  In December 2009, she had simple and complex partial seizures monthly, typically associated with left-sided jerking and left Todd's paresis, these lasted five to eight minutes and tended to occur between midnight and 5 a.m. She was  on Felbatol and Lamictal.   On August 12, 2008, the patient had an EEG that showed frequent high amplitude spike discharges bifrontally that occurred in runs of three to four, this was more prominent over the right than the left hemisphere particularly at Fp2 and F4. She had one ictal event that happened after her medications had been withdrawn. She extended her left leg upward and outward, her head deviated tonically to the right, she vocalized and had tonic stiffening of her entire body, she was cyanotic, she had clonic activity right greater than left. This started in the left parasagittal region, migrated to the left temple followed by generalized myogenic artifact. This was followed by a rhythmic 2 Hz spike and wave abnormality prominent over the right hemisphere and declined in frequency.  Ictal SPECT showed uptake in the left frontal region. This was believed to be not typical of her seizures. She was sent home on Lamictal and Trileptal, but was unable to tolerate Trileptal and was placed on Felbatol.   The record showed some confusion, but the patient was on the combination of Lamictal and Felbatol when I saw her next on September 20, 2011. She lost a tremendous amount of weight, but unfortunately began to experience myoclonus. Myoclonus was fairly frequent, but generalized seizures were relatively infrequent.  Birth History Term infant to a 54 year old primigravida female  Normal spontaneous vaginal delivery  Nursery course was unremarkable.  Development was normal except for problems with articulation. I believe that she has intellectual disability in the form of significant learning differences.  Behavior History none  Surgical History History reviewed. No pertinent surgical history.  Family History family history includes Cancer in her paternal grandmother; Lung cancer in her maternal grandfather. Family history is negative for migraines, seizures, intellectual disabilities,  blindness, deafness, birth defects, chromosomal disorder, or autism.  Social History Socioeconomic History  . Marital status: Single  . Years of education:  32  . Highest education level:  High school certificate  Occupational History  . Not employed  Tobacco Use  . Smoking status: Passive Smoke Exposure - Never Smoker  . Smokeless tobacco: Never Used  . Tobacco comment: Step father smokes   Substance and Sexual Activity  . Alcohol use: No    Alcohol/week: 0.0 standard drinks  . Drug use: No  . Sexual activity: Not Currently    Birth control/protection: None  Social History Narrative    Maycel is a 24 yo woman who has graduated. She lives with her mom. She has 2 brothers. She enjoys sleeping, listening to music and talking to friends. Maythe is not currently working or in school.    Allergies Allergen Reactions  . Dust Mite Extract    Physical Exam Wt 200 lb (90.7 kg)   BMI 31.32 kg/m   General: alert, well developed, obese, in no acute distress, brown hair, hazel eyes, right handed Head: normocephalic, no dysmorphic features Neck: supple, full range of motion Musculoskeletal: no skeletal deformities or apparent scoliosis Skin: no rashes or neurocutaneous lesions  Neurologic Exam  Mental Status: alert; oriented to person; knowledge is below normal for age; language is adequate for history taking and following commands Cranial Nerves: visual fields are full to double simultaneous stimuli; extraocular movements are full and conjugate; symmetric facial strength; midline tongue and uvula; hearing appears normal bilaterally Motor: normal functional strength, tone and mass; good fine motor movements; no pronator drift Coordination: good finger-to-nose, rapid repetitive alternating movements and finger apposition Gait and Station: normal gait and station: patient is able to walk on heels, toes and tandem without difficulty; balance is adequate; Romberg exam is negative; Gower  response is negative  Assessment 1.  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 2.  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 3.  Mild intellectual disability, F70.  Discussion I am pleased that Areliz's seizures are in good control.  I think that the episode that was sent to me was a nonepileptic event and discussed that with the patient and her mother.  Plan Her antiepileptic medicines will be continued unchanged.  If she gets to 6 months with no seizures I will begin to taper and discontinue her other medications, starting with the divalproex.  I encouraged her mother to get her more involved in activities of the home.  I encouraged Mahiya to get vaccinated for Covid.  She will return to see me in 4 months.  Greater than 50% of a 20-minute was spent in counseling and coordination of care concerning her seizures and her activity.  I refilled prescriptions for divalproex and felbamate.   Medication List   Accurate as of January 11, 2020 10:09 AM. If you have any questions, ask your nurse or doctor.      TAKE these medications   divalproex 500 MG 24 hr tablet Commonly known as: DEPAKOTE ER Take 2 tablets (1,000 mg total) by mouth at bedtime.   felbamate 600 MG tablet Commonly known as: FELBATOL Take 2-1/2 tablets twice daily   [name] 2 MG tablet Generic drug: perampanel Take 1 tablet at nighttime   Nexplanon 68 MG Impl implant Generic drug: etonogestrel Nexplanon 68 mg subdermal implant  Inject by subcutaneous route. Due for removal in 3 years    The medication list was reviewed and reconciled. All changes or newly prescribed medications were explained.  A complete medication list was provided to the patient/caregiver.  Deetta Perla MD

## 2020-01-11 NOTE — Patient Instructions (Signed)
It was a pleasure to see you today.  I am glad that your seizures are in good control.  The seizure that your mother sent a video I think was nonepileptic because you were fully awake in both sides of the body removing.  I cannot be A[place]ley certain it was not a focal seizure but I do not think that it was.  Please keep in touch with me if there are further events.  Overall I think the Allyne Gee is working well.  If he remains seizure-free over 6 months, we will slightly increase the dose of the [name] and begin to discontinue some of the other medications.  I would like to see you again in 4 months.  I sent your prescription refills to [facility].  I hope that was correct.

## 2020-01-23 ENCOUNTER — Ambulatory Visit (INDEPENDENT_AMBULATORY_CARE_PROVIDER_SITE_OTHER): Payer: BC Managed Care – PPO | Admitting: Pediatrics

## 2020-02-20 ENCOUNTER — Other Ambulatory Visit (INDEPENDENT_AMBULATORY_CARE_PROVIDER_SITE_OTHER): Payer: Self-pay | Admitting: Pediatrics

## 2020-02-20 DIAGNOSIS — G40319 Generalized idiopathic epilepsy and epileptic syndromes, intractable, without status epilepticus: Secondary | ICD-10-CM

## 2020-02-22 NOTE — Telephone Encounter (Signed)
This medication needs a PA.  Mom went to the pharmacy today to pick it up.  Aiyonna will be out of medication tonight.  Please call mom.

## 2020-03-11 ENCOUNTER — Encounter (INDEPENDENT_AMBULATORY_CARE_PROVIDER_SITE_OTHER): Payer: Self-pay

## 2020-04-29 ENCOUNTER — Encounter (INDEPENDENT_AMBULATORY_CARE_PROVIDER_SITE_OTHER): Payer: Self-pay

## 2020-04-29 DIAGNOSIS — G40319 Generalized idiopathic epilepsy and epileptic syndromes, intractable, without status epilepticus: Secondary | ICD-10-CM

## 2020-05-01 MED ORDER — FYCOMPA 4 MG PO TABS: ORAL_TABLET | ORAL | 5 refills | Status: DC

## 2020-05-01 NOTE — Telephone Encounter (Signed)
Prescription was electronically sent for 4 mg of [name].

## 2020-05-01 NOTE — Addendum Note (Signed)
Addended by: Deetta Perla on: 05/01/2020 06:01 AM   Modules accepted: Orders

## 2020-05-05 ENCOUNTER — Other Ambulatory Visit: Payer: Self-pay

## 2020-05-05 ENCOUNTER — Encounter (INDEPENDENT_AMBULATORY_CARE_PROVIDER_SITE_OTHER): Payer: Self-pay | Admitting: Pediatrics

## 2020-05-05 ENCOUNTER — Ambulatory Visit (INDEPENDENT_AMBULATORY_CARE_PROVIDER_SITE_OTHER): Payer: BC Managed Care – PPO | Admitting: Pediatrics

## 2020-05-05 VITALS — BP 130/80 | HR 84 | Ht 67.0 in | Wt 190.8 lb

## 2020-05-05 DIAGNOSIS — G40219 Localization-related (focal) (partial) symptomatic epilepsy and epileptic syndromes with complex partial seizures, intractable, without status epilepticus: Secondary | ICD-10-CM | POA: Diagnosis not present

## 2020-05-05 DIAGNOSIS — G40319 Generalized idiopathic epilepsy and epileptic syndromes, intractable, without status epilepticus: Secondary | ICD-10-CM | POA: Diagnosis not present

## 2020-05-05 DIAGNOSIS — G253 Myoclonus: Secondary | ICD-10-CM | POA: Diagnosis not present

## 2020-05-05 DIAGNOSIS — F70 Mild intellectual disabilities: Secondary | ICD-10-CM | POA: Diagnosis not present

## 2020-05-05 NOTE — Patient Instructions (Addendum)
It was a pleasure to see you today.  Sorry that she had a recurrent seizure.  As I told you I am not certain that we know why you have breakthrough seizures.  It is my hope that increasing [name] from 2 mg to 4 mg will do a better job of controlling her seizures.  We are not going to make any change in divalproex, or felbamate.  Please let me know what happens with the disability.  I only there is anything else that I can do, but as you know I think that she should qualify for it.  I would like to see her in 4 months I will see her sooner based on clinical need.  It is my hope that we will get Dr. Karel Jarvis to take over her care after I retire in September.

## 2020-05-05 NOTE — Progress Notes (Signed)
Patient: Rebecca Chandler MRN: [id number] Sex: female DOB: Feb 06, 1996  Provider: Ellison Carwin, MD Location of Care: [facility]  Note type: Routine return visit  History of Present Illness: Referral Source: Lucky Cowboy, MD History from: mother, patient and [facility] chart Chief Complaint: Seizures  Rebecca Chandler is a 25 y.o. female who was evaluated April 04, 2020 for the first time since January 11, 2020.  She had onset of seizures at 34 months of age which have rarely been controlled.  History of her medications as described in detail Jul 18, 2019.  He is also in the past medical history.  She was placed on [name] at a very low dose of 2 mg which she tolerated and which seemed to work extremely well to prove that her seizures.  Unfortunately she had a seizure on May 01, 2020.  This is a generalized tonic-clonic seizure.  She been compliant with her medication.  She got up and fell severely bruising herself and spraining her right ankle.  She also severely bit her tongue.  I was notified and recommended increasing [name] from 2 mg to 4 mg.  She has been on that for 2 days without side effects.  She also takes divalproex and felbamate.  It is very important that she not get pregnant because these can cause very significant birth defects.  She is treated with Nexplanon.  She lives at home with her mother.  Fortunately no one has contracted COVID.  Mother's been vaccinated but not boosted.  The patient has refused.  She filed for disability but was denied.  I think that this is unfortunate and incorrect.  She has never been able to work because of the frequency and severity of her seizures.  The only way she could work is if her seizures were brought under complete control.  As of recently they have been under better control.  Her general health is good.  Her weight is down 9 pounds which I did not notice.  I am very pleased about that.  Review  of Systems: A complete review of systems was remarkable for patient is here to be seen for seizures. Momreports that she believes thepatient had a seizure last week. The patient does not remember but she is bruised and has a boot on her right foot from swelling. She has no other concerns at this time., all other systems reviewed and negative.  Past Medical History Diagnosis Date   Seizures (HCC)    Hospitalizations: No., Head Injury: No., Nervous System Infections: No., Immunizations up to date: Yes.    Copied from prior chart notes Rebecca Chandler had onset of seizures when she was [age] months of age. Seizures were myoclonic associated with widening of her eyes. Mother remembers that I used Depakote initially. At some point, she was switched to [place]. She was able to come off antiepileptic medications and was seizure free from age 75 to 46.  At that time, she had nocturnal arousals with staring, grunting, and sucking sounds with opening and closing her eyelids lasting for five minutes. Her head turned toward the right, these increased from once a month to once a week.  [facility]. No events were recorded. She was placed on Tegretol and Keppra. Tegretol was discontinued and Felbatol restarted. In January 2003, she had a 6-day EMU evaluation that showed rare interictal right hemispheric sharp waves, she remained on Keppra and Felbatol.  In December  2009, she had simple and complex partial seizures monthly, typically associated with left-sided jerking and left Todd's paresis, these lasted five to eight minutes and tended to occur between midnight and 5 a.m. She was on Felbatol and Lamictal.   On August 12, 2008, the patient had an EEG that showed frequent high amplitude spike discharges bifrontally that occurred in runs of three to four, this was more prominent over the right than the left hemisphere particularly at Fp2 and F4. She had one ictal  event that happened after her medications had been withdrawn. She extended her left leg upward and outward, her head deviated tonically to the right, she vocalized and had tonic stiffening of her entire body, she was cyanotic, she had clonic activity right greater than left. This started in the left parasagittal region, migrated to the left temple followed by generalized myogenic artifact. This was followed by a rhythmic 2 Hz spike and wave abnormality prominent over the right hemisphere and declined in frequency.  Ictal SPECT showed uptake in the left frontal region. This was believed to be not typical of her seizures. She was sent home on Lamictal and Trileptal, but was unable to tolerate Trileptal and was placed on Felbatol.   The record showed some confusion, but the patient was on the combination of Lamictal and Felbatol when I saw her next on September 20, 2011. She lost a tremendous amount of weight, but unfortunately began to experience myoclonus. Myoclonus was fairly frequent, but generalized seizures were relatively infrequent.  Birth History Term infant to a 29 year old primigravida female  Normal spontaneous vaginal delivery  Nursery course was unremarkable.  Development was normal except for problems with articulation. I believe that she has intellectual disability in the form of significant learning differences.  Behavior History none  Surgical History History reviewed. No pertinent surgical history.  Family History family history includes Cancer in her paternal grandmother; Lung cancer in her maternal grandfather. Family history is negative for migraines, seizures, intellectual disabilities, blindness, deafness, birth defects, chromosomal disorder, or autism.  Social History Socioeconomic History   Marital status: Single/boyfriend   Years of education:  13   Highest education level:  High school certificate  Occupational History   Not employed  Tobacco Use    Smoking status: Passive Smoke Exposure - Never Smoker   Smokeless tobacco: Never Used   Tobacco comment: Step father smokes   Substance and Sexual Activity   Alcohol use: No    Alcohol/week: 0.0 standard drinks   Drug use: No   Sexual activity: Not Currently    Birth control/protection: None  Social History Narrative    Laiba is a 25 yo woman who has graduated. She lives with her boyfriend. She has 2 brothers. She enjoys sleeping, listening to music and talking to friends. Ramon is not currently working or in school.    Allergies Allergen Reactions   Dust Mite Extract    Physical Exam BP 130/80    Pulse 84    Ht 5\' 7"  (1.702 m)    Wt 190 lb 12.8 oz (86.5 kg)    BMI 29.88 kg/m   General: alert, well developed, obese, in no acute distress, brown with purple highlights hair, hazel eyes, right handed Head: normocephalic, no dysmorphic features Ears, Nose and Throat: Otoscopic: tympanic membranes normal; pharynx: oropharynx is pink without exudates or tonsillar hypertrophy Neck: supple, full range of motion, no cranial or cervical bruits Respiratory: auscultation clear Cardiovascular: no murmurs, pulses are normal Musculoskeletal: no skeletal deformities  or apparent scoliosis Skin: no rashes or neurocutaneous lesions  Neurologic Exam  Mental Status: alert; oriented to person, place and year; knowledge is normal for age; language is normal Cranial Nerves: visual fields are full to double simultaneous stimuli; extraocular movements are full and conjugate; pupils are round reactive to light; funduscopic examination shows sharp disc margins with normal vessels; symmetric facial strength; midline tongue and uvula; air conduction is greater than bone conduction bilaterally Motor: Normal strength, tone and mass; good fine motor movements; no pronator drift Sensory: intact responses to cold, vibration, proprioception and stereognosis Coordination: good finger-to-nose, rapid  repetitive alternating movements and finger apposition Gait and Station: antalgic gait and station, walks with a crutch Reflexes: symmetric and diminished bilaterally; no clonus; bilateral flexor plantar responses  Assessment 1.  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 2.  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 3.  Mild intellectual disability, F70.  Discussion I am pleased that seizure control has improved with [name].  We will increase the dose and see if that brings about complete control.  Plan Prescription was issued for 4 mg of [name] which she is started.  She will return to see me in 4 months.  Greater than 50% of the 30-minute visit was spent in counseling and coordination of care concerning her seizures and their management as well as long-term transition of care.  I am hopeful that I will be able to get Dr. Karel Jarvis to follow her.   Medication List   Accurate as of May 05, 2020  9:48 PM. If you have any questions, ask your nurse or doctor.    divalproex 500 MG 24 hr tablet Commonly known as: DEPAKOTE ER Take 2 tablets (1,000 mg total) by mouth at bedtime.   etonogestrel 68 MG Impl implant Commonly known as: NEXPLANON Nexplanon 68 mg subdermal implant  Inject by subcutaneous route. Due for removal in 3 years   felbamate 600 MG tablet Commonly known as: FELBATOL Take 2-1/2 tablets twice daily   [name] 4 MG Tabs Generic drug: Perampanel Take 1 tablet at nighttime. What changed: Another medication with the same name was removed. Continue taking this medication, and follow the directions you see here. Changed by: Ellison Carwin, MD    The medication list was reviewed and reconciled. All changes or newly prescribed medications were explained.  A complete medication list was provided to the patient/caregiver.  Deetta Perla MD

## 2020-05-26 ENCOUNTER — Telehealth (INDEPENDENT_AMBULATORY_CARE_PROVIDER_SITE_OTHER): Payer: Self-pay | Admitting: Pediatrics

## 2020-05-26 NOTE — Telephone Encounter (Signed)
Who's calling (name and relationship to patient) : Rebecca Chandler mom   Best contact number: [phone]  Provider they see: Dr. Sharene Skeans  Reason for call: Mom called asking to speak with Dr. Sharene Skeans. Mom's pcp requested that she ask him where she could be seen herself for ALD by a provider that knew more information on this and could help her.   Call ID:      PRESCRIPTION REFILL ONLY  Name of prescription:  Pharmacy:

## 2020-06-14 ENCOUNTER — Encounter (INDEPENDENT_AMBULATORY_CARE_PROVIDER_SITE_OTHER): Payer: Self-pay

## 2020-06-20 ENCOUNTER — Encounter (INDEPENDENT_AMBULATORY_CARE_PROVIDER_SITE_OTHER): Payer: Self-pay

## 2020-06-20 DIAGNOSIS — G40319 Generalized idiopathic epilepsy and epileptic syndromes, intractable, without status epilepticus: Secondary | ICD-10-CM

## 2020-06-30 MED ORDER — FYCOMPA 6 MG PO TABS: ORAL_TABLET | ORAL | 5 refills | Status: DC

## 2020-06-30 NOTE — Telephone Encounter (Signed)
16-minute phone call with mom.  She is particularly worried because [name] has gotten up on 2 occasions and fallen very hard injuring herself.  This is new.  Previous seizures she lay in bed and bit her tongue.  Mom sees these is much more dangerous and I understand why.  She not having significant side effects on the medication.  She was having at that time with her parents which is why she was upset.  Would increase [name] to 6 mg.  We will see if it makes her too sleepy or if it improves the frequency and severity of her seizures

## 2020-06-30 NOTE — Addendum Note (Signed)
Addended by: Deetta Perla on: 06/30/2020 05:15 PM   Modules accepted: Orders

## 2020-07-13 ENCOUNTER — Encounter (INDEPENDENT_AMBULATORY_CARE_PROVIDER_SITE_OTHER): Payer: Self-pay

## 2020-08-29 DIAGNOSIS — R946 Abnormal results of thyroid function studies: Secondary | ICD-10-CM | POA: Diagnosis not present

## 2020-08-29 DIAGNOSIS — Z13220 Encounter for screening for lipoid disorders: Secondary | ICD-10-CM | POA: Diagnosis not present

## 2020-08-29 DIAGNOSIS — Z79899 Other long term (current) drug therapy: Secondary | ICD-10-CM | POA: Diagnosis not present

## 2020-08-29 DIAGNOSIS — R7309 Other abnormal glucose: Secondary | ICD-10-CM | POA: Diagnosis not present

## 2020-08-29 DIAGNOSIS — H93299 Other abnormal auditory perceptions, unspecified ear: Secondary | ICD-10-CM | POA: Diagnosis not present

## 2020-08-29 DIAGNOSIS — Z87898 Personal history of other specified conditions: Secondary | ICD-10-CM | POA: Diagnosis not present

## 2020-09-04 ENCOUNTER — Ambulatory Visit (INDEPENDENT_AMBULATORY_CARE_PROVIDER_SITE_OTHER): Payer: BC Managed Care – PPO | Admitting: Pediatrics

## 2020-09-04 ENCOUNTER — Encounter: Payer: Self-pay | Admitting: Neurology

## 2020-09-04 ENCOUNTER — Other Ambulatory Visit: Payer: Self-pay

## 2020-09-04 ENCOUNTER — Encounter (INDEPENDENT_AMBULATORY_CARE_PROVIDER_SITE_OTHER): Payer: Self-pay | Admitting: Pediatrics

## 2020-09-04 VITALS — BP 112/90 | HR 100 | Ht 67.0 in | Wt 191.4 lb

## 2020-09-04 DIAGNOSIS — G40319 Generalized idiopathic epilepsy and epileptic syndromes, intractable, without status epilepticus: Secondary | ICD-10-CM

## 2020-09-04 DIAGNOSIS — G40219 Localization-related (focal) (partial) symptomatic epilepsy and epileptic syndromes with complex partial seizures, intractable, without status epilepticus: Secondary | ICD-10-CM | POA: Diagnosis not present

## 2020-09-04 DIAGNOSIS — F70 Mild intellectual disabilities: Secondary | ICD-10-CM

## 2020-09-04 DIAGNOSIS — H9325 Central auditory processing disorder: Secondary | ICD-10-CM

## 2020-09-04 NOTE — Progress Notes (Signed)
Patient: Rebecca Chandler MRN: [id number] Sex: female DOB: [birth year]  Provider: Ellison Carwin, MD Location of Care: [facility]  Note type: Routine return visit  History of Present Illness: Referral Source: Lucky Cowboy, MD History from: both parents, patient, and [facility] chart Chief Complaint: Seizures  Rebecca Chandler is a 25 y.o. female who was evaluated September 04, 2020 for the first time since May 05, 2020.  She had onset of seizures at 35 months of age which have never been controlled for more than a year at a time.  Recently they have been uncontrolled.  She is been on a variety of medications which are detailed in the past medical history.  Currently she takes the combination of [name], divalproex, and felbamate.  [name] has been the most useful medication although she has experienced some sleepiness from time to time.  Mother said that she is also had episodes of crying and anger which she blames on [name].  We observed and her symptoms began to subside over time without change in medication.  The majority of her seizures have been nocturnal generalized tonic-clonic seizures.  She has had 2 seizures that caused her to fall.  She gotten up and fell injuring herself.  Given other times that she is injured her self and mother was for some reason particularly concerned in part I think it is because [name] is living with her and is more immediate.  We increased her dose from 4 mg to 6 mg.  Fortunately she is tolerated-and there have been no more seizures since April 15.  In general her health is good.  She has not contracted COVID.  She is refused the vaccine.  She has a video disability interview on September 14.  Disability has been denied before.  I explained to her mother that though I understand that Rebecca Chandler cannot be gainfully employed and therefore disability makes sense, disability is ordinarily not granted to people who are able to for the  most part take care of themselves.  She goes to bed at 10:30 PM she sleeps soundly until 10 AM.  Review of Systems: A complete review of systems was remarkable for patient is here to be seen for a follow up, all other systems reviewed and negative.  Past Medical History Diagnosis Date   Seizures (HCC)    Hospitalizations: No., Head Injury: No., Nervous System Infections: No., Immunizations up to date: Yes.    Copied from prior chart notes [name] had onset of seizures when she was [age] months of age. Seizures were myoclonic associated with widening of her eyes. Mother remembers that I used Depakote initially.  At some point, she was switched to [facility]. She was able to come off antiepileptic medications and was seizure free from age 25 to 44.    At that time, she had nocturnal arousals with staring, grunting, and sucking sounds with opening and closing her eyelids lasting for five minutes. Her head turned toward the right, these increased from once a month to once a week.    She was sent to [facility] for the first of many EMU evaluations. No events were recorded. She was placed on Tegretol and Keppra. Tegretol was discontinued and Felbatol restarted. In January 2003, she had a 6-day EMU evaluation that showed rare interictal right hemispheric sharp waves, she remained on Keppra and Felbatol.    In December 2009, she had simple and complex partial seizures monthly, typically associated with left-sided jerking and left Todd's paresis,  these lasted five to eight minutes and tended to occur between midnight and 5 a.m. She was on Felbatol and Lamictal.   On August 12, 2008, the patient had an EEG that showed frequent high amplitude spike discharges bifrontally that occurred in runs of three to four, this was more prominent over the right than the left hemisphere particularly at Fp2 and F4.  She had one ictal event that happened after her medications had been withdrawn. She extended her left leg  upward and outward, her head deviated tonically to the right, she vocalized and had tonic stiffening of her entire body, she was cyanotic, she had clonic activity right greater than left. This started in the left parasagittal region, migrated to the left temple followed by generalized myogenic artifact. This was followed by a rhythmic 2 Hz spike and wave abnormality prominent over the right hemisphere and declined in frequency.    Ictal SPECT showed uptake in the left frontal region. This was believed to be not typical of her seizures. She was sent home on Lamictal and Trileptal, but was unable to tolerate Trileptal and was placed on Felbatol.   The record showed some confusion, but the patient was on the combination of Lamictal and Felbatol when I saw her next on September 20, 2011. She lost a tremendous amount of weight, but unfortunately began to experience myoclonus. Myoclonus was fairly frequent, but generalized seizures were relatively infrequent.  In May 2021 she was on Vimpat which made her drowsy.  We were unable to escalate her dose to a therapeutic range.  I decided to switch her to [facility] which initially worked brilliantly.  Today did still been the most effective medication that we have used.   Birth History Term infant to a 71 year old primigravida female   Normal spontaneous vaginal delivery   Nursery course was unremarkable.   Development was normal except for problems with articulation. I believe that she has intellectual disability in the form of significant learning differences.   Behavior History none  Behavior History none  Surgical History History reviewed. No pertinent surgical history.  Family History family history includes Cancer in her paternal grandmother; Lung cancer in her maternal grandfather. Family history is negative for migraines, seizures, intellectual disabilities, blindness, deafness, birth defects, chromosomal disorder, or autism.  Social  History Socioeconomic History   Marital status: Single   Years of education: 13   Highest education level: High school certificate  Occupational History   Not employed  Tobacco Use   Smoking status: Passive Smoke Exposure - Never Smoker   Smokeless tobacco: Never   Tobacco comments:    Step father smokes   Substance and Sexual Activity   Alcohol use: No    Alcohol/week: 0.0 standard drinks   Drug use: No   Sexual activity: Not Currently    Birth control/protection: None  Social History Narrative   Mikaiya is a 25 yo woman who has graduated. She lives with her mother.  She has 2 brothers. She enjoys sleeping, listening to music and talking to friends. Latayna is not currently working or in school.    Allergies Allergen Reactions   Dust Mite Extract    Physical Exam BP 112/90   Pulse 100   Ht 5\' 7"  (1.702 m)   Wt 191 lb 6.4 oz (86.8 kg)   BMI 29.98 kg/m   General: alert, well developed, well nourished, in no acute distress, brown/blue highlights hair, hazel eyes, right handed Head: normocephalic, no dysmorphic features Ears, Nose and  Throat: Otoscopic: tympanic membranes normal; pharynx: oropharynx is pink without exudates or tonsillar hypertrophy Neck: supple, full range of motion, no cranial or cervical bruits Respiratory: auscultation clear Cardiovascular: no murmurs, pulses are normal Musculoskeletal: no skeletal deformities or apparent scoliosis Skin: no rashes or neurocutaneous lesions  Neurologic Exam  Mental Status: alert; oriented to person, place and year; knowledge is below normal for age; language is normal Cranial Nerves: visual fields are full to double simultaneous stimuli; extraocular movements are full and conjugate; pupils are round reactive to light; funduscopic examination shows sharp disc margins with normal vessels; symmetric facial strength; midline tongue and uvula; air conduction is greater than bone conduction bilaterally Motor: normal strength,  tone and mass; good fine motor movements; no pronator drift Sensory: intact responses to cold, vibration, proprioception and stereognosis Coordination: good finger-to-nose, rapid repetitive alternating movements and finger apposition Gait and Station: normal gait and station: patient is able to walk on heels, toes and tandem without difficulty; balance is adequate; Romberg exam is negative; Gower response is negative Reflexes: symmetric and diminished bilaterally; no clonus; bilateral flexor plantar responses   Assessment 1.  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 2.  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 3.  Mild intellectual disability, F70.  Discussion I am pleased that for now she seems to be doing fairly well as regards her seizures.  I recognize she is on polypharmacy and would like to simplify her regimen.  We would have to have at least 6 months and seizure freedom before I felt comfortable removing any of her medications.  Plan Is a long time since she has had an EEG.  It would be reasonable to perform one.  We have left her on divalproex, felbamate, and [name].  We will refer her to Dr. Patrcia Dolly at [facility].  Greater than 50% of a 30-minute visit was spent in counseling and coordination of care regarding her seizures, benefits and side effects of the medication, and discussing transition of care issues.  I hope that she can be seen before I retire December 05, 2020 in case there are any questions or concerns that Dr. Karel Jarvis has.   Medication List    Accurate as of September 04, 2020  9:01 AM. If you have any questions, ask your nurse or doctor.     divalproex 500 MG 24 hr tablet Commonly known as: DEPAKOTE ER Take 2 tablets (1,000 mg total) by mouth at bedtime.   etonogestrel 68 MG Impl implant Commonly known as: NEXPLANON Nexplanon 68 mg subdermal implant  Inject by subcutaneous route. Due for removal in 3  years   felbamate 600 MG tablet Commonly known as: FELBATOL Take 2-1/2 tablets twice daily   [name] 6 MG Tabs Generic drug: Perampanel Take 1 tablet daily     The medication list was reviewed and reconciled. All changes or newly prescribed medications were explained.  A complete medication list was provided to the patient/caregiver.  Deetta Perla MD

## 2020-09-04 NOTE — Patient Instructions (Addendum)
At [facility], we are committed to providing exceptional care. You will receive a patient satisfaction survey through text or email regarding your visit today. Your opinion is important to me. Comments are appreciated.   It was a pleasure to see you today.  It has been a privilege to take care of Rebecca Chandler for almost all of her life.  I am going to refer you to Dr. Patrcia Dolly, an epileptologist at [facility].  Hopefully she will be able to see Rebecca Chandler before I retire September 30.  There is no reason to change her medication.  All of her prescriptions are up-to-date.

## 2020-09-22 ENCOUNTER — Telehealth (INDEPENDENT_AMBULATORY_CARE_PROVIDER_SITE_OTHER): Payer: Self-pay | Admitting: Pediatrics

## 2020-09-22 NOTE — Telephone Encounter (Signed)
  Who's calling (name and relationship to patient) :Rebecca Chandler, Rebecca Chandler (Self)  Best contact number: [phone] (Home Provider they see:  Deetta Perla, MD Reason for call: Medical Source Statement of Ability to do Work- related activities left in [place] box. Please contact patient when completed.    PRESCRIPTION REFILL ONLY  Name of prescription:  Pharmacy:

## 2020-10-01 ENCOUNTER — Telehealth (INDEPENDENT_AMBULATORY_CARE_PROVIDER_SITE_OTHER): Payer: Self-pay | Admitting: Pediatrics

## 2020-10-01 NOTE — Telephone Encounter (Signed)
Forms have been placed on Dr. Hickling's desk 

## 2020-10-01 NOTE — Telephone Encounter (Signed)
Father dropped off Rebecca Chandler forms to be completed for patient. I have placed these in Dr. Darl Householder box at the front. Barrington Ellison

## 2020-10-07 ENCOUNTER — Other Ambulatory Visit (INDEPENDENT_AMBULATORY_CARE_PROVIDER_SITE_OTHER): Payer: Self-pay | Admitting: Family

## 2020-10-07 DIAGNOSIS — G40319 Generalized idiopathic epilepsy and epileptic syndromes, intractable, without status epilepticus: Secondary | ICD-10-CM

## 2020-10-07 MED ORDER — FYCOMPA 6 MG PO TABS: ORAL_TABLET | ORAL | 5 refills | Status: DC

## 2020-10-07 NOTE — Telephone Encounter (Signed)
The form and a prescription was faxed to [facility] Patient Assistance program. TG

## 2020-10-09 ENCOUNTER — Telehealth (INDEPENDENT_AMBULATORY_CARE_PROVIDER_SITE_OTHER): Payer: Self-pay | Admitting: Pediatrics

## 2020-10-09 NOTE — Telephone Encounter (Signed)
I talked with the people at [facility].  For some reason they do not reimburse patients who have idiopathic generalized epilepsy, intractable, without status epilepticus.  This makes absolutely no sense but I was able to come up with some codes that they except that are medically factual.

## 2020-11-03 ENCOUNTER — Ambulatory Visit: Payer: PRIVATE HEALTH INSURANCE | Admitting: Neurology

## 2020-12-08 ENCOUNTER — Ambulatory Visit: Payer: PRIVATE HEALTH INSURANCE | Admitting: Neurology

## 2020-12-30 ENCOUNTER — Telehealth (INDEPENDENT_AMBULATORY_CARE_PROVIDER_SITE_OTHER): Payer: Self-pay | Admitting: Pediatrics

## 2020-12-30 DIAGNOSIS — G40309 Generalized idiopathic epilepsy and epileptic syndromes, not intractable, without status epilepticus: Secondary | ICD-10-CM

## 2020-12-30 DIAGNOSIS — G40209 Localization-related (focal) (partial) symptomatic epilepsy and epileptic syndromes with complex partial seizures, not intractable, without status epilepticus: Secondary | ICD-10-CM

## 2020-12-30 DIAGNOSIS — G40319 Generalized idiopathic epilepsy and epileptic syndromes, intractable, without status epilepticus: Secondary | ICD-10-CM

## 2020-12-30 MED ORDER — FELBAMATE 600 MG PO TABS: ORAL_TABLET | ORAL | 0 refills | Status: DC

## 2020-12-30 MED ORDER — DIVALPROEX SODIUM ER 500 MG PO TB24: 1000.0000 mg | ORAL_TABLET | Freq: Every day | ORAL | 0 refills | Status: DC

## 2020-12-30 NOTE — Telephone Encounter (Signed)
Who's calling (name and relationship to patient) : Rebecca Chandler mom  Best contact number: [phone]  Provider they see: Dr. Sharene Skeans  Reason for call: Mom called stating patient's insurance dropped when she turned 25. Patient hasn't been established with [facility] yet. Mom states that patients meds are running out and would like to know if [facility] could refill a months worth of meds   Call ID:      PRESCRIPTION REFILL ONLY  Name of prescription:  Pharmacy:

## 2020-12-30 NOTE — Telephone Encounter (Signed)
Please let Mom know that I sent in refills for Felbamate and Divalproex to the pharmacy. Thanks, Inetta Fermo

## 2020-12-31 ENCOUNTER — Telehealth (INDEPENDENT_AMBULATORY_CARE_PROVIDER_SITE_OTHER): Payer: Self-pay | Admitting: Pediatrics

## 2020-12-31 MED ORDER — FYCOMPA 6 MG PO TABS: ORAL_TABLET | ORAL | 5 refills | Status: DC

## 2020-12-31 NOTE — Telephone Encounter (Signed)
Spoke to mom, she states that this message was left yesterday, and I spoke to her earlier about this issue and it is being taken care of by [place].

## 2020-12-31 NOTE — Telephone Encounter (Signed)
Spoke to mom, per Tina's message. Mom states that she also needs the medication fycompa prescription faxed to [facility] because she 1 year free with them,mom provided fax number.

## 2020-12-31 NOTE — Telephone Encounter (Signed)
  Who's calling (name and relationship to patient) : Lars Masson; mom  Best contact number: [phone]  Provider they see: Dr. Sharene Skeans  Reason for call: Mom stated issue with medication. Also stated patient has 14 pills left.    PRESCRIPTION REFILL ONLY  Name of prescription: [name]  Pharmacy:

## 2020-12-31 NOTE — Telephone Encounter (Signed)
A prescription was sent in August with 5 refills so I called the [facility] Patient Assistance program who said that Rx was recorded as only 1 refill so that is why they need additional refills. I sent an Rx electronically to [facility], the pharmacy that provides the [facility] patient assistance program medications. TG

## 2021-01-20 ENCOUNTER — Ambulatory Visit: Payer: PRIVATE HEALTH INSURANCE | Admitting: Neurology

## 2021-02-16 ENCOUNTER — Ambulatory Visit: Payer: PRIVATE HEALTH INSURANCE | Admitting: Neurology

## 2021-02-23 ENCOUNTER — Telehealth (INDEPENDENT_AMBULATORY_CARE_PROVIDER_SITE_OTHER): Payer: Self-pay | Admitting: Family

## 2021-02-23 DIAGNOSIS — G40309 Generalized idiopathic epilepsy and epileptic syndromes, not intractable, without status epilepticus: Secondary | ICD-10-CM

## 2021-02-23 DIAGNOSIS — G40209 Localization-related (focal) (partial) symptomatic epilepsy and epileptic syndromes with complex partial seizures, not intractable, without status epilepticus: Secondary | ICD-10-CM

## 2021-02-23 MED ORDER — DIVALPROEX SODIUM ER 500 MG PO TB24
1000.0000 mg | ORAL_TABLET | Freq: Every day | ORAL | 0 refills | Status: DC
Start: 2021-02-23 — End: 2021-03-10

## 2021-02-23 MED ORDER — FELBAMATE 600 MG PO TABS: ORAL_TABLET | ORAL | 0 refills | Status: DC

## 2021-02-23 NOTE — Telephone Encounter (Signed)
Spoke to mom per [employer].

## 2021-02-23 NOTE — Telephone Encounter (Signed)
Please let Mom know that I sent in the refills she requested. Thanks, Inetta Fermo

## 2021-02-23 NOTE — Telephone Encounter (Signed)
°  Who's calling (name and relationship to patient) : Mom   Best contact number:[phone]  Provider they see: Inetta Fermo   Reason for call: Patient is due to see Adult nuerologist Jan 3rd. Patient needs to have Inetta Fermo authorize meds this last time. Her last does is Tuesday  Depakote and the Felbamate     PRESCRIPTION REFILL ONLY  Name of prescription: Depakote and the Felbamate   Pharmacy: [facility] [address]

## 2021-03-10 ENCOUNTER — Other Ambulatory Visit: Payer: Self-pay

## 2021-03-10 ENCOUNTER — Ambulatory Visit (INDEPENDENT_AMBULATORY_CARE_PROVIDER_SITE_OTHER): Payer: Medicaid Other | Admitting: Neurology

## 2021-03-10 ENCOUNTER — Encounter: Payer: Self-pay | Admitting: Neurology

## 2021-03-10 VITALS — BP 115/84 | HR 89 | Ht 67.0 in | Wt 205.2 lb

## 2021-03-10 DIAGNOSIS — G40219 Localization-related (focal) (partial) symptomatic epilepsy and epileptic syndromes with complex partial seizures, intractable, without status epilepticus: Secondary | ICD-10-CM | POA: Diagnosis not present

## 2021-03-10 MED ORDER — FELBAMATE 600 MG PO TABS: ORAL_TABLET | ORAL | 11 refills | Status: DC

## 2021-03-10 MED ORDER — FYCOMPA 4 MG PO TABS: ORAL_TABLET | ORAL | 5 refills | Status: DC

## 2021-03-10 MED ORDER — DIVALPROEX SODIUM ER 500 MG PO TB24: 1000.0000 mg | ORAL_TABLET | Freq: Every day | ORAL | 11 refills | Status: DC

## 2021-03-10 NOTE — Patient Instructions (Signed)
Good to meet you!  Reduce [name] back to 4mg  every night  2. Continue Depakote ER 500mg : take 2 tablets every night and Felbatol 600mg : Take 2 and 1/2 tablets twice a day  3. Continue seizure diary  4. Follow-up with PCP for regular bloodwork  5. Follow-up in 4 months, call for any changes   Seizure Precautions: 1. If medication has been prescribed for you to prevent seizures, take it exactly as directed.  Do not stop taking the medicine without talking to your doctor first, even if you have not had a seizure in a long time.   2. Avoid activities in which a seizure would cause danger to yourself or to others.  Don't operate dangerous machinery, swim alone, or climb in high or dangerous places, such as on ladders, roofs, or girders.  Do not drive unless your doctor says you may.  3. If you have any warning that you may have a seizure, lay down in a safe place where you can't hurt yourself.    4.  No driving for 6 months from last seizure, as per [facility].   Please refer to the following link on the [employer]ile[employer] of America's website for more information: http://www.epilepsyfoundation.org/answerplace/Social/driving/drivingu.cfm   5.  Maintain good sleep hygiene. Avoid alcohol.  6.  Notify your neurology if you are planning pregnancy or if you become pregnant.  7.  Contact your doctor if you have any problems that may be related to the medicine you are taking.  8.  Call 911 and bring the patient back to the ED if:        A.  The seizure lasts longer than 5 minutes.       B.  The patient doesn't awaken shortly after the seizure  C.  The patient has new problems such as difficulty seeing, speaking or moving  D.  The patient was injured during the seizure  E.  The patient has a temperature over 102 F (39C)  F.  The patient vomited and now is having trouble breathing

## 2021-03-10 NOTE — Progress Notes (Signed)
NEUROLOGY CONSULTATION NOTE  Rebecca Chandler MRN: [id number] DOB: May 07, 1995  Referring provider: Dr. Unk Pinto Primary care provider: Dr. Unk Pinto  Reason for consult:  establish adult epilepsy care  Dear Dr Melford Aase:  Thank you for your kind referral of Rebecca Chandler for consultation of the above symptoms. Although her history is well known to you, please allow me to reiterate it for the purpose of our medical record. The patient was accompanied to the clinic by her mother Rebecca Chandler who also provides collateral information. Records and images were personally reviewed where available.   HISTORY OF PRESENT ILLNESS: This is a 26 year old left-handed woman with a history of intractable epilepsy presenting to establish adult epilepsy care. Records from her pediatric neurologist Dr. Gaynell Face were reviewed and will be summarized as follows. Seizures started at 30 months of age with myoclonus associated with widening of her eyes. She was initially on Depakote then switched to [facility], became seizure-free from age [age] and was weaned off medication. She then started having nocturnal arousals with staring, grunting, and sucking sounds, opening and closing her eyelids for 5 minutes. Head would be turned to the right. She was having an increase in frequency of these episodes and was evaluated at [facility]. Typical events were not captured, she was discharged on Tegretol and Keppra. Tegretol was discontinued and Felbatol restarted. She had another 6-day EMU admission in January 2003 which showed rare interictal right hemisphere sharp waves. In December 2009, she had simple and complex partial seizures monthly, usually associated with left-sided jerking and left-Todd's paresis, lasting 5-8 minutes, usually between midnight and 5am. At this point she was on Felbatol and Lamictal. In June 2010, she had an EEG showing frequent high amplitude spike discharges bifrontally that  occurred in runs of three to four, more prominent over the right than the left hemisphere particularly at Fp2 and F4.  There was one ictal event after medications were stopped where left leg extended upward and outward, her head deviated tonically to the right, she vocalized and had tonic stiffening of her entire body, followed by clonic activity right greater than left. Seizure started in the left parasagittal region, migrated to the left temporal followed by generalized myogenic artifact, followed by rhythmic 2 Hz spike and wave abnormality prominent over the right hemisphere. Ictal SPECT showed uptake in the left frontal region, it was felt that it was not typical of her seizures. She was discharged on Trileptal and Lamictal but did not tolerate Trileptal and switched back to Felbatol. In 2013, they were reporting more myoclonus. In May 2021, she was started on Vimpat which made her drowsy, she was switched to [facility] which helped better. Per Dr. Melanee Left notes, [name] has been the most useful medication although she would have occasional drowsiness. Her mother also reported episodes of crying and anger that was blamed on [name], but these began to subside without any change in medication. Majority of her seizures have been nocturnal generalized convulsions that would cause her to fall. [name] was increased to 6mg  in 08/2020.   Her mother reports that majority of her seizures are nocturnal, between 2-4am. They may be in sleep transitions where she is going to the bathroom, one time her mother found a trail of urine from the hallway to the living room. She has bitten her tongue several times with the seizures. She denies any prior warning/prodrome to the seizures, when she wakes up they feel something always happens on her right  side. She denies any myoclonus, olfactory/gustatory hallucinations, focal numbness/tingling/weakness. She denies any headaches. She gets dizzy after taking her night dose of [name],  worse when dose was increased to 6mg . She denies any diplopia, dysarthria/dysphagia, neck/back pain, bowel/bladder dysfunction. Her mother was letting her manage her own medications for the past couple of years, but found out 3 months ago that she was not taking her medications as prescribed when her mother did a pill count. Since then, she has been using a pillbox and alarm, with no seizures in the past 3 months. They would like to go back to 4mg  qhs now that she is compliant. She is also on Felbatol 600mg  2 and 1/2 tabs BID and Depakote ER 500mg  2 tabs qhs. She lives with her parents and stays mostly at home watching TV and social media. She does not drive. She has Nexplanon for birth control. They report "a little depression," she is sleeping a lot, although sleep is interrupted. She will be seeing a therapist.  Epilepsy Risk Factors:  Her paternal grandmother and uncle were diagnosed with seizures. She had a normal birth and early development.  There is no history of febrile convulsions, CNS infections such as meningitis/encephalitis, significant traumatic brain injury, neurosurgical procedures.  Prior ASMs: Tegretol, Trileptal, Keppra, Vimpat, Lamictal  Diagnostic Data: EEGs: Per Dr. Melanee Left note: "[facility] EMU January 2003 reported showed rare interictal right hemisphere sharp waves.   [place] EMU in June 2010 reported frequent high amplitude spike discharges bifrontally that occurred in runs of three to four, more prominent over the right than the left hemisphere particularly at Fp2 and F4.  There was one ictal event after medications were stopped where left leg extended upward and outward, her head deviated tonically to the right, she vocalized and had tonic stiffening of her entire body, followed by clonic activity right greater than left. Seizure started in the left parasagittal region, migrated to the left temporal followed by generalized myogenic artifact, followed by rhythmic 2 Hz  spike and wave abnormality prominent over the right hemisphere.  Ictal SPECT showed uptake in the left frontal region, it was felt that it was not typical of her seizures."  MRI: MRI with and without contrast in 07/2006 was normal.   PAST MEDICAL HISTORY: Past Medical History:  Diagnosis Date   Seizures ([place])     PAST SURGICAL HISTORY: History reviewed. No pertinent surgical history.  MEDICATIONS: Current Outpatient Medications on File Prior to Visit  Medication Sig Dispense Refill   divalproex (DEPAKOTE ER) 500 MG 24 hr tablet Take 2 tablets (1,000 mg total) by mouth at bedtime. 60 tablet 0   etonogestrel (NEXPLANON) 68 MG IMPL implant Nexplanon 68 mg subdermal implant  Inject by subcutaneous route. Due for removal in 3 years     felbamate (FELBATOL) 600 MG tablet Take 2-1/2 tablets twice daily 150 tablet 0   [name] 6 MG TABS Take 1 tablet daily 30 tablet 5   No current facility-administered medications on file prior to visit.    ALLERGIES: Allergies  Allergen Reactions   Dust Mite Extract     FAMILY HISTORY: Family History  Problem Relation Age of Onset   Lung cancer Maternal Grandfather        Died at 79   Cancer Paternal Grandmother        Died at 6    SOCIAL HISTORY: Social History   Socioeconomic History   Marital status: Single    Spouse name: Not on file  Number of children: Not on file   Years of education: Not on file   Highest education level: Not on file  Occupational History   Not on file  Tobacco Use   Smoking status: Never    Passive exposure: Yes   Smokeless tobacco: Never   Tobacco comments:    Step father smokes   Substance and Sexual Activity   Alcohol use: No    Alcohol/week: 0.0 standard drinks   Drug use: No   Sexual activity: Not Currently    Birth control/protection: None  Other Topics Concern   Not on file  Social History Narrative   Emiko is a 26 yo woman who has graduated. She lives with her boyfriend. She has 2  brothers. She enjoys sleeping, listening to music and talking to friends. Bissie is not currently working or in school.    Social Determinants of Health   Financial Resource Strain: Not on file  Food Insecurity: Not on file  Transportation Needs: Not on file  Physical Activity: Not on file  Stress: Not on file  Social Connections: Not on file  Intimate Partner Violence: Not on file     PHYSICAL EXAM: Vitals:   03/10/21 0903  BP: 115/84  Pulse: 89  SpO2: 100%   General: No acute distress Head:  Normocephalic/atraumatic Skin/Extremities: No rash, no edema Neurological Exam: Mental status: alert and oriented to person, place, and time, no dysarthria or aphasia, Fund of knowledge is appropriate.  Recent and remote memory are intact, 3/3 delayed recall.  Attention and concentration are normal, 5/5 WORLD backwards. Cranial nerves: CN I: not tested CN II: pupils equal, round and reactive to light, visual fields intact CN III, IV, VI:  full range of motion, no nystagmus, no ptosis CN V: facial sensation intact CN VII: upper and lower face symmetric CN VIII: hearing intact to conversation Bulk & Tone: normal, no fasciculations. Motor: 5/5 throughout with no pronator drift. Sensation: intact to light touch, cold, pin, vibration sense.  No extinction to double simultaneous stimulation.  Romberg test negative Deep Tendon Reflexes: +1 throughout Cerebellar: no incoordination on finger to nose testing Gait: narrow-based and steady, able to tandem walk adequately. Tremor: none   IMPRESSION: This is a 26 year old left-handed woman with a history of intractable epilepsy presenting to establish adult epilepsy care. She has focal to bilateral tonic-clonic epilepsy, etiology unknown. She has had repeated EEGs in the past, with report of right hemisphere sharp waves, as well as bilateral high amplitude spike discharges bifrontally, R>L. Seizure captured in 2010 started in the left parasagittal  region. They report improvement in seizures with better medication compliance, as well as side effects on higher dose [name]. Reduce back to Fycmopa 4mg  qhs. Continue Depakote ER 1000mg  qhs and Felbatol 600mg  2 and 1/2 tabs BID, refills sent. Issues in women with epilepsy discussed, no pregnancy plans. She does not drive. We discussed avoidance of seizure triggers. Follow-up with PCP for safety labs. Continue seizure calendar, follow-up in 4 months, they know to call for any changes.    Thank you for allowing me to participate in the care of this patient. Please do not hesitate to call for any questions or concerns.   Ellouise Newer, M.D.  CC: Dr. Melford Aase

## 2021-03-13 ENCOUNTER — Telehealth: Payer: Self-pay | Admitting: Neurology

## 2021-03-13 DIAGNOSIS — G40219 Localization-related (focal) (partial) symptomatic epilepsy and epileptic syndromes with complex partial seizures, intractable, without status epilepticus: Secondary | ICD-10-CM

## 2021-03-13 MED ORDER — FYCOMPA 4 MG PO TABS: ORAL_TABLET | ORAL | 5 refills | Status: DC

## 2021-03-13 NOTE — Telephone Encounter (Signed)
Pls let mom know I sent in e-script to [facility], thanks

## 2021-03-13 NOTE — Telephone Encounter (Signed)
Pt mother called no answer voice mail left that script was called in

## 2021-03-13 NOTE — Telephone Encounter (Signed)
Patients mother called the company her daughter gets her free medication from, they told her the doctors office has to send it in. Its for the 4mg  [name]  [place]- [facility] [place]- [phone]

## 2021-06-08 ENCOUNTER — Telehealth: Payer: Self-pay | Admitting: Neurology

## 2021-06-08 NOTE — Telephone Encounter (Signed)
Patients mom called and stated Rebecca Chandler is having acid reflux and wanted to know what she can take.  She has read and some things it says not to take with the medications that she is on. ?

## 2021-06-09 NOTE — Telephone Encounter (Signed)
Pls let mom know that the usual reflux medications should not interfere with her medications. Once she speaks to her PCP and they have a specific medication suggested, we can check again for any interactions. Thanks ?

## 2021-06-09 NOTE — Telephone Encounter (Signed)
Pt mom called no answer left a voice mail to call the office back  ?

## 2021-06-10 NOTE — Telephone Encounter (Signed)
Spoke with pt mom informed her that the usual reflux medications should not interfere with her medications. Once she speaks to her PCP and they have a specific medication suggested, we can check again for any interactions  ?

## 2021-07-08 ENCOUNTER — Encounter: Payer: Self-pay | Admitting: Neurology

## 2021-07-08 ENCOUNTER — Ambulatory Visit: Payer: Medicaid Other | Admitting: Neurology

## 2021-07-08 VITALS — BP 125/90 | HR 101 | Ht 67.0 in | Wt 191.0 lb

## 2021-07-08 DIAGNOSIS — G40219 Localization-related (focal) (partial) symptomatic epilepsy and epileptic syndromes with complex partial seizures, intractable, without status epilepticus: Secondary | ICD-10-CM

## 2021-07-08 NOTE — Progress Notes (Signed)
? ?NEUROLOGY FOLLOW UP OFFICE NOTE ? ?Rebecca Chandler ?[id number] ?05/03/1995 ? ?HISTORY OF PRESENT ILLNESS: ?I had the pleasure of seeing Rebecca Chandler in follow-up in the neurology clinic on 07/08/2021.  The patient was last seen 4 months ago for intractable epilepsy. She is again accompanied by her father who helps supplement the history today.  Records and images were personally reviewed where available. On her last visit, they reported side effects on 6mg  [name], dose reduced to 4mg  qhs. She is also on Felbatol 600mg  2 and 1/2 tabs BID and Depakote ER 500mg  2 tabs qhs. She is happy to report that she has been seizure-free since October 2022, and she is doing much better on her current regimen. She just feels a little dizzy after taking her night medications, but otherwise feels fine. Her father denies any staring/unresponsive episodes. She denies any focal numbness/tingling, myoclonic jerks. No headaches, vision changes. Her PCP started her on magnesium and vitamin D supplements. She had a fall 2 weeks ago and fractured her right knee cap, currently right leg is in a brace. Sleep is good. Mood is pretty good.  ? ? ?History On Initial Assessment 03/10/2021: This is a 26 year old left-handed woman with a history of intractable epilepsy presenting to establish adult epilepsy care. Records from her pediatric neurologist Dr. were reviewed and will be summarized as follows. Seizures started at 41 months of age with myoclonus associated with widening of her eyes. She was initially on Depakote then switched to [facility], became seizure-free from age [age] and was weaned off medication. She then started having nocturnal arousals with staring, grunting, and sucking sounds, opening and closing her eyelids for 5 minutes. Head would be turned to the right. She was having an increase in frequency of these episodes and was evaluated at [facility]. Typical events were not captured, she was discharged on  Tegretol and Keppra. Tegretol was discontinued and Felbatol restarted. She had another 6-day EMU admission in January 2003 which showed rare interictal right hemisphere sharp waves. In December 2009, she had simple and complex partial seizures monthly, usually associated with left-sided jerking and left-Todd's paresis, lasting 5-8 minutes, usually between midnight and 5am. At this point she was on Felbatol and Lamictal. In June 2010, she had an EEG showing frequent high amplitude spike discharges bifrontally that occurred in runs of three to four, more prominent over the right than the left hemisphere particularly at Fp2 and F4.  There was one ictal event after medications were stopped where left leg extended upward and outward, her head deviated tonically to the right, she vocalized and had tonic stiffening of her entire body, followed by clonic activity right greater than left. Seizure started in the left parasagittal region, migrated to the left temporal followed by generalized myogenic artifact, followed by rhythmic 2 Hz spike and wave abnormality prominent over the right hemisphere. Ictal SPECT showed uptake in the left frontal region, it was felt that it was not typical of her seizures. She was discharged on Trileptal and Lamictal but did not tolerate Trileptal and switched back to Felbatol. In 2013, they were reporting more myoclonus. In May 2021, she was started on Vimpat which made her drowsy, she was switched to [facility] which helped better. Per Dr. 06-03-1996 notes, 2014 has been the most useful medication although she would have occasional drowsiness. Her mother also reported episodes of crying and anger that was blamed on [name], but these began to subside without any change in medication.  Majority of her seizures have been nocturnal generalized convulsions that would cause her to fall. [name] was increased to 6mg  in 08/2020.  ? ?Her mother reports that majority of her seizures are nocturnal, between  2-4am. They may be in sleep transitions where she is going to the bathroom, one time her mother found a trail of urine from the hallway to the living room. She has bitten her tongue several times with the seizures. She denies any prior warning/prodrome to the seizures, when she wakes up they feel something always happens on her right side. She denies any myoclonus, olfactory/gustatory hallucinations, focal numbness/tingling/weakness. She denies any headaches. She gets dizzy after taking her night dose of [name], worse when dose was increased to 6mg . She denies any diplopia, dysarthria/dysphagia, neck/back pain, bowel/bladder dysfunction. Her mother was letting her manage her own medications for the past couple of years, but found out 3 months ago that she was not taking her medications as prescribed when her mother did a pill count. Since then, she has been using a pillbox and alarm, with no seizures in the past 3 months. They would like to go back to 4mg  qhs now that she is compliant. She is also on Felbatol 600mg  2 and 1/2 tabs BID and Depakote ER 500mg  2 tabs qhs. She lives with her parents and stays mostly at home watching TV and social media. She does not drive. She has Nexplanon for birth control. They report "a little depression," she is sleeping a lot, although sleep is interrupted. She will be seeing a therapist. ? ?Epilepsy Risk Factors:  Her paternal grandmother and uncle were diagnosed with seizures. She had a normal birth and early development.  There is no history of febrile convulsions, CNS infections such as meningitis/encephalitis, significant traumatic brain injury, neurosurgical procedures. ? ?Prior ASMs: Tegretol, Trileptal, Keppra, Vimpat, Lamictal ? ?Diagnostic Data: ?EEGs: ?Per Dr. Darl HouseholderHickling's note: ?"[facility] 2003 reported showed rare interictal right hemisphere sharp waves.  ? ?[facility] frequent high amplitude spike discharges bifrontally that  occurred in runs of three to four, more prominent over the right than the left hemisphere particularly at Fp2 and F4.  There was one ictal event after medications were stopped where left leg extended upward and outward, her head deviated tonically to the right, she vocalized and had tonic stiffening of her entire body, followed by clonic activity right greater than left. Seizure started in the left parasagittal region, migrated to the left temporal followed by generalized myogenic artifact, followed by rhythmic 2 Hz spike and wave abnormality prominent over the right hemisphere. ? ?Ictal SPECT showed uptake in the left frontal region, it was felt that it was not typical of her seizures." ? ?MRI: ?MRI with and without contrast in 07/2006 was normal.  ? ? ?PAST MEDICAL HISTORY: ?Past Medical History:  ?Diagnosis Date  ? Seizures ([facility])   ? ? ?MEDICATIONS: ?Current Outpatient Medications on File Prior to Visit  ?Medication Sig Dispense Refill  ? divalproex (DEPAKOTE ER) 500 MG 24 hr tablet Take 2 tablets (1,000 mg total) by mouth at bedtime. 60 tablet 11  ? etonogestrel (NEXPLANON) 68 MG IMPL implant Nexplanon 68 mg subdermal implant ? Inject by subcutaneous route. Due for removal in 3 years    ? felbamate (FELBATOL) 600 MG tablet Take 2 and 1/2 tablets twice daily 150 tablet 11  ? Perampanel ([name]) 4 MG TABS Take 1 tablet every night 30 tablet 5  ? ?No current facility-administered  medications on file prior to visit.  ? ? ?ALLERGIES: ?Allergies  ?Allergen Reactions  ? Dust Mite Extract   ? ? ?FAMILY HISTORY: ?Family History  ?Problem Relation Age of Onset  ? Lung cancer Maternal Grandfather   ?     Died at 49  ? Cancer Paternal Grandmother   ?     Died at 76  ? ? ?SOCIAL HISTORY: ?Social History  ? ?Socioeconomic History  ? Marital status: Single  ?  Spouse name: Not on file  ? Number of children: Not on file  ? Years of education: Not on file  ? Highest education level: Not on file  ?Occupational History  ? Not on  file  ?Tobacco Use  ? Smoking status: Never  ?  Passive exposure: Yes  ? Smokeless tobacco: Never  ? Tobacco comments:  ?  Step father smokes   ?Vaping Use  ? Vaping Use: Never used  ?Substance and Sexual Activ

## 2021-07-08 NOTE — Patient Instructions (Signed)
Good to see you. Continue all your medications, let me know when you need refills. Follow-up in 6 months, call for any changes.  ? ? ?Seizure Precautions: ?1. If medication has been prescribed for you to prevent seizures, take it exactly as directed.  Do not stop taking the medicine without talking to your doctor first, even if you have not had a seizure in a long time.  ? ?2. Avoid activities in which a seizure would cause danger to yourself or to others.  Don't operate dangerous machinery, swim alone, or climb in high or dangerous places, such as on ladders, roofs, or girders.  Do not drive unless your doctor says you may. ? ?3. If you have any warning that you may have a seizure, lay down in a safe place where you can't hurt yourself.   ? ?4.  No driving for 6 months from last seizure, as per [facility].   Please refer to the following link on the Epile[employer] Foundation of America's website for more information: http://www.epilepsyfoundation.org/answerplace/Social/driving/drivingu.cfm  ? ?5.  Maintain good sleep hygiene. Avoid alcohol. ? ?6.  Notify your neurology if you are planning pregnancy or if you become pregnant. ? ?7.  Contact your doctor if you have any problems that may be related to the medicine you are taking. ? ?8.  Call 911 and bring the patient back to the ED if: ?      ? A.  The seizure lasts longer than 5 minutes.      ? B.  The patient doesn't awaken shortly after the seizure ? C.  The patient has new problems such as difficulty seeing, speaking or moving ? D.  The patient was injured during the seizure ? E.  The patient has a temperature over 102 F (39C) ? F.  The patient vomited and now is having trouble breathing ?      ? ?

## 2021-09-03 ENCOUNTER — Other Ambulatory Visit: Payer: Self-pay | Admitting: Neurology

## 2021-09-03 DIAGNOSIS — G40219 Localization-related (focal) (partial) symptomatic epilepsy and epileptic syndromes with complex partial seizures, intractable, without status epilepticus: Secondary | ICD-10-CM

## 2021-09-03 MED ORDER — FYCOMPA 4 MG PO TABS: ORAL_TABLET | ORAL | 5 refills | Status: DC

## 2021-09-03 NOTE — Telephone Encounter (Signed)
1. Which medications need refilled? (List name and dosage, if known) [name]  2. Which pharmacy/location is medication to be sent to? (include street and city if local pharmacy) [facility] on [address]  Pt's mother requests a call once it's sent since she is low.

## 2022-01-11 ENCOUNTER — Telehealth: Payer: Self-pay | Admitting: Neurology

## 2022-01-11 NOTE — Telephone Encounter (Signed)
Please reassure patient that she will be okay.  If she has already taken her dose of Depakote and [name] today, then take the next dose tomorrow with night time medications to get back to her routine schedule.  She's probably sleepy because of medication side effect.

## 2022-01-11 NOTE — Telephone Encounter (Signed)
Spoke to pt mother she stated that Rebecca Chandler said she is feeling fine she stated that they will get back on routine tomorrow,

## 2022-01-11 NOTE — Telephone Encounter (Signed)
Patient's mom called and said she gave her daughter the night doses of her medications Depakote and [name] today and she is very concerned this may cause a problem.

## 2022-01-11 NOTE — Telephone Encounter (Signed)
Spoke with pt mother this morning they pulled the night time medication instead of the morning ones and she took the Depakote that she normally only takes at the and her normal dose of fycompa mom is worried because she has gone back to bed and is sleeping. They just want to make sure that she is going to be ok? Taken the night time dose this morning of the the depakote and should she take it tonight? Will it be to much in her system?

## 2022-01-19 ENCOUNTER — Ambulatory Visit: Payer: Medicaid Other | Admitting: Neurology

## 2022-01-22 ENCOUNTER — Ambulatory Visit (INDEPENDENT_AMBULATORY_CARE_PROVIDER_SITE_OTHER): Payer: Medicaid Other | Admitting: Neurology

## 2022-01-22 ENCOUNTER — Encounter: Payer: Self-pay | Admitting: Neurology

## 2022-01-22 DIAGNOSIS — G40219 Localization-related (focal) (partial) symptomatic epilepsy and epileptic syndromes with complex partial seizures, intractable, without status epilepticus: Secondary | ICD-10-CM

## 2022-01-22 MED ORDER — FYCOMPA 4 MG PO TABS: ORAL_TABLET | ORAL | 5 refills | Status: DC

## 2022-01-22 MED ORDER — DIVALPROEX SODIUM ER 250 MG PO TB24: ORAL_TABLET | ORAL | 11 refills | Status: DC

## 2022-01-22 MED ORDER — FELBAMATE 600 MG PO TABS: ORAL_TABLET | ORAL | 11 refills | Status: DC

## 2022-01-22 NOTE — Patient Instructions (Signed)
Good to see you doing well.  We will reduce Depakote a little. We will switch to Depakote ER 250mg  tablet: take 3 tablets every night (750mg  total every night).   2. Continue Felbatol and [name]  3. Follow-up in 3 months, call for any changes   Seizure Precautions: 1. If medication has been prescribed for you to prevent seizures, take it exactly as directed.  Do not stop taking the medicine without talking to your doctor first, even if you have not had a seizure in a long time.   2. Avoid activities in which a seizure would cause danger to yourself or to others.  Don't operate dangerous machinery, swim alone, or climb in high or dangerous places, such as on ladders, roofs, or girders.  Do not drive unless your doctor says you may.  3. If you have any warning that you may have a seizure, lay down in a safe place where you can't hurt yourself.    4.  No driving for 6 months from last seizure, as per [facility].   Please refer to the following link on the [employer]ile[employer]'s website for more information: http://www.epilepsyfoundation.org/answerplace/Social/driving/drivingu.cfm   5.  Maintain good sleep hygiene. Avoid alcohol.  6.  Notify your neurology if you are planning pregnancy or if you become pregnant.  7.  Contact your doctor if you have any problems that may be related to the medicine you are taking.  8.  Call 911 and bring the patient back to the ED if:        A.  The seizure lasts longer than 5 minutes.       B.  The patient doesn't awaken shortly after the seizure  C.  The patient has new problems such as difficulty seeing, speaking or moving  D.  The patient was injured during the seizure  E.  The patient has a temperature over 102 F (39C)  F.  The patient vomited and now is having trouble breathing

## 2022-01-22 NOTE — Progress Notes (Signed)
NEUROLOGY FOLLOW UP OFFICE NOTE  Rebecca Chandler [id number] [birth year]  HISTORY OF PRESENT ILLNESS: I had the pleasure of seeing Rebecca Chandler in follow-up in the neurology clinic on 01/22/2022.  The patient was last seen 6 months ago for intractable epilepsy. She is again accompanied by her father who helps supplement the history today.  Records and images were personally reviewed where available.  She is currently on [name] 4mg  qhs, Felbatol 600mg  2 and 1/2 tabs BID, and Depakote ER 500mg  2 tabs qhs. She has been seizure-free since 01/05/21. They deny any staring/unresponsive episodes, gaps in time, olfactory/gustatory hallucinations, focal numbness/tingling/weakness, myoclonic jerks. She still gets a little dizzy after taking her night medications, her father notes this is stable as long as she eats well. She is also more active during the day. She gets around 8 hours of sleep, and may take a brief nap after her morning medication, then active the rest of the day. She denies any headaches, vision changes, no falls. Mood is good. She lives with her mother.    History On Initial Assessment 03/10/2021: This is a 26 year old left-handed woman with a history of intractable epilepsy presenting to establish adult epilepsy care. Records from her pediatric neurologist Dr. Gaynell Face were reviewed and will be summarized as follows. Seizures started at 3 months of age with myoclonus associated with widening of her eyes. She was initially on Depakote then switched to [facility], became seizure-free from age 43-4 and was weaned off medication. She then started having nocturnal arousals with staring, grunting, and sucking sounds, opening and closing her eyelids for 5 minutes. Head would be turned to the right. She was having an increase in frequency of these episodes and was evaluated at [facility]. Typical events were not captured, she was discharged on Tegretol and Keppra. Tegretol was discontinued and  Felbatol restarted. She had another 6-day EMU admission in January 2003 which showed rare interictal right hemisphere sharp waves. In December 2009, she had simple and complex partial seizures monthly, usually associated with left-sided jerking and left-Todd's paresis, lasting 5-8 minutes, usually between midnight and 5am. At this point she was on Felbatol and Lamictal. In June 2010, she had an EEG showing frequent high amplitude spike discharges bifrontally that occurred in runs of three to four, more prominent over the right than the left hemisphere particularly at Fp2 and F4.  There was one ictal event after medications were stopped where left leg extended upward and outward, her head deviated tonically to the right, she vocalized and had tonic stiffening of her entire body, followed by clonic activity right greater than left. Seizure started in the left parasagittal region, migrated to the left temporal followed by generalized myogenic artifact, followed by rhythmic 2 Hz spike and wave abnormality prominent over the right hemisphere. Ictal SPECT showed uptake in the left frontal region, it was felt that it was not typical of her seizures. She was discharged on Trileptal and Lamictal but did not tolerate Trileptal and switched back to Felbatol. In 2013, they were reporting more myoclonus. In May 2021, she was started on Vimpat which made her drowsy, she was switched to [facility] which helped better. Per Dr. Melanee Left notes, Rebecca Chandler has been the most useful medication although she would have occasional drowsiness. Her mother also reported episodes of crying and anger that was blamed on [name], but these began to subside without any change in medication. Majority of her seizures have been nocturnal generalized convulsions that would cause her  to fall. [name] was increased to 6mg  in 08/2020.   Her mother reports that majority of her seizures are nocturnal, between 2-4am. They may be in sleep transitions where she  is going to the bathroom, one time her mother found a trail of urine from the hallway to the living room. She has bitten her tongue several times with the seizures. She denies any prior warning/prodrome to the seizures, when she wakes up they feel something always happens on her right side. She denies any myoclonus, olfactory/gustatory hallucinations, focal numbness/tingling/weakness. She denies any headaches. She gets dizzy after taking her night dose of [name], worse when dose was increased to 6mg . She denies any diplopia, dysarthria/dysphagia, neck/back pain, bowel/bladder dysfunction. Her mother was letting her manage her own medications for the past couple of years, but found out 3 months ago that she was not taking her medications as prescribed when her mother did a pill count. Since then, she has been using a pillbox and alarm, with no seizures in the past 3 months. They would like to go back to 4mg  qhs now that she is compliant. She is also on Felbatol 600mg  2 and 1/2 tabs BID and Depakote ER 500mg  2 tabs qhs. She lives with her parents and stays mostly at home watching TV and social media. She does not drive. She has Nexplanon for birth control. They report "a little depression," she is sleeping a lot, although sleep is interrupted. She will be seeing a therapist.  Epilepsy Risk Factors:  Her paternal grandmother and uncle were diagnosed with seizures. She had a normal birth and early development.  There is no history of febrile convulsions, CNS infections such as meningitis/encephalitis, significant traumatic brain injury, neurosurgical procedures.  Prior ASMs: Tegretol, Trileptal, Keppra, Vimpat, Lamictal, side effects on [name] 6mg  dose  Diagnostic Data: EEGs: Per Dr. Melanee Left note: "[facility] EMU January 2003 reported showed rare interictal right hemisphere sharp waves.   [facility] EMU in June 2010 reported frequent high amplitude spike discharges bifrontally that occurred in runs of  three to four, more prominent over the right than the left hemisphere particularly at Fp2 and F4.  There was one ictal event after medications were stopped where left leg extended upward and outward, her head deviated tonically to the right, she vocalized and had tonic stiffening of her entire body, followed by clonic activity right greater than left. Seizure started in the left parasagittal region, migrated to the left temporal followed by generalized myogenic artifact, followed by rhythmic 2 Hz spike and wave abnormality prominent over the right hemisphere.  Ictal SPECT showed uptake in the left frontal region, it was felt that it was not typical of her seizures."  MRI: MRI with and without contrast in 07/2006 was normal.    PAST MEDICAL HISTORY: Past Medical History:  Diagnosis Date   Seizures ([place])     MEDICATIONS: Current Outpatient Medications on File Prior to Visit  Medication Sig Dispense Refill   divalproex (DEPAKOTE ER) 500 MG 24 hr tablet Take 2 tablets (1,000 mg total) by mouth at bedtime. 60 tablet 11   etonogestrel (NEXPLANON) 68 MG IMPL implant Nexplanon 68 mg subdermal implant  Inject by subcutaneous route. Due for removal in 3 years     felbamate (FELBATOL) 600 MG tablet Take 2 and 1/2 tablets twice daily 150 tablet 11   Perampanel ([name]) 4 MG TABS Take 1 tablet every night 30 tablet 5   No current facility-administered medications on file prior to visit.  ALLERGIES: Allergies  Allergen Reactions   Dust Mite Extract     FAMILY HISTORY: Family History  Problem Relation Age of Onset   Lung cancer Maternal Grandfather        Died at 7   Cancer Paternal Grandmother        Died at 22    SOCIAL HISTORY: Social History   Socioeconomic History   Marital status: Single    Spouse name: Not on file   Number of children: Not on file   Years of education: Not on file   Highest education level: Not on file  Occupational History   Not on file  Tobacco Use    Smoking status: Never    Passive exposure: Yes   Smokeless tobacco: Never   Tobacco comments:    Step father smokes   Vaping Use   Vaping Use: Never used  Substance and Sexual Activity   Alcohol use: No    Alcohol/week: 0.0 standard drinks of alcohol   Drug use: No   Sexual activity: Not Currently    Birth control/protection: None  Other Topics Concern   Not on file  Social History Narrative   Inette is a 26 yo woman who has graduated. She lives with her boyfriend. She has 2 brothers. She enjoys sleeping, listening to music and talking to friends. Shamyia is not currently working or in school. Left hand    Social Determinants of Health   Financial Resource Strain: Not on file  Food Insecurity: Not on file  Transportation Needs: Not on file  Physical Activity: Not on file  Stress: Not on file  Social Connections: Not on file  Intimate Partner Violence: Not on file     PHYSICAL EXAM: Vitals:   01/22/22 0847  BP: 136/84  Pulse: 99  Resp: 18  SpO2: 100%   General: No acute distress Head:  Normocephalic/atraumatic Skin/Extremities: No rash, no edema Neurological Exam: alert and awake. No aphasia or dysarthria. Fund of knowledge is appropriate. Attention and concentration are normal.   Cranial nerves: Pupils equal, round. Extraocular movements intact with no nystagmus. Visual fields full.  No facial asymmetry.  Motor: Bulk and tone normal, muscle strength 5/5 throughout with no pronator drift.   Finger to nose testing intact.  Gait narrow-based and steady, able to tandem walk adequately.  Romberg negative.   IMPRESSION: This is a 26 yo LH woman with intractable epilepsy, etiology unknown. She has had repeated EEGs in the past, with report of right hemisphere sharp waves, as well as bilateral high amplitude spike discharges bifrontally, R>L. Seizure captured in 2010 started in the left parasagittal region. She has been seizure-free for a year. They had a previous discussion  with Dr. Sharene Skeans that he would want to try and get her off Depakote with addition of [name]. Since she has been doing well, we discussed a gradual taper, reduce Depakote to 750mg  qhs (she will switch to Depakote ER 250mg  tabs: take 3 tabs qhs), continue [name] 4mg  qhs and Felbatol 600mg  2 and 1/2 tabs BID. She does not drive. Follow-up in 3 months, they know to call for any changes.   Thank you for allowing me to participate in her care.  Please do not hesitate to call for any questions or concerns.    , M.D.   CC: Dr. 

## 2022-03-12 ENCOUNTER — Other Ambulatory Visit: Payer: Self-pay | Admitting: Neurology

## 2022-03-12 DIAGNOSIS — G40219 Localization-related (focal) (partial) symptomatic epilepsy and epileptic syndromes with complex partial seizures, intractable, without status epilepticus: Secondary | ICD-10-CM

## 2022-04-13 ENCOUNTER — Telehealth: Payer: Self-pay | Admitting: Pharmacy Technician

## 2022-04-13 DIAGNOSIS — G40219 Localization-related (focal) (partial) symptomatic epilepsy and epileptic syndromes with complex partial seizures, intractable, without status epilepticus: Secondary | ICD-10-CM

## 2022-04-13 NOTE — Telephone Encounter (Signed)
PA has been APPROVED from 04/13/2022-20/07/2023 for 191mls per 30 days

## 2022-04-13 NOTE — Telephone Encounter (Signed)
Patient Advocate Encounter   Received notification that prior authorization for Felbamate 600MG  tablets is required.   PA submitted on 04/13/2022 Key [id number] Status is pending       Lyndel Safe, [place] Patient Advocate Specialist [place] Patient Advocate Team Direct Number: [phone]  Fax: [phone]

## 2022-04-15 MED ORDER — FELBAMATE 600 MG PO TABS: ORAL_TABLET | ORAL | 5 refills | Status: DC

## 2022-04-15 NOTE — Telephone Encounter (Signed)
Pt mother called back no answer left a voice mail to call the office back  

## 2022-04-15 NOTE — Telephone Encounter (Signed)
Pt's mother called in and left a message stating they have been having trouble getting the pt's Felbamate filled at the pharmacy. They would like to speak with someone.

## 2022-04-15 NOTE — Telephone Encounter (Signed)
Pt mother called back she was informed that [facility]'s felbamate has been approved and new script sent to pharmacy

## 2022-04-27 ENCOUNTER — Encounter: Payer: Self-pay | Admitting: Neurology

## 2022-04-27 ENCOUNTER — Ambulatory Visit (INDEPENDENT_AMBULATORY_CARE_PROVIDER_SITE_OTHER): Payer: Medicaid Other | Admitting: Neurology

## 2022-04-27 VITALS — BP 130/86 | HR 112 | Ht 67.0 in | Wt 198.3 lb

## 2022-04-27 DIAGNOSIS — G40219 Localization-related (focal) (partial) symptomatic epilepsy and epileptic syndromes with complex partial seizures, intractable, without status epilepticus: Secondary | ICD-10-CM

## 2022-04-27 MED ORDER — DIVALPROEX SODIUM ER 250 MG PO TB24: ORAL_TABLET | ORAL | 11 refills | Status: DC

## 2022-04-27 MED ORDER — FYCOMPA 4 MG PO TABS: ORAL_TABLET | ORAL | 5 refills | Status: DC

## 2022-04-27 NOTE — Patient Instructions (Signed)
Good to see you doing well. Let's continue slowly getting off Depakote.  Reduce Depakote ER 269m: take 2 tablets every night for 1 month,  then reduce to 1 tablet every night for 1 month, then stop.   Continue [name] and Felbatol.   Follow-up in 3 months, call for any changes.    Seizure Precautions: 1. If medication has been prescribed for you to prevent seizures, take it exactly as directed.  Do not stop taking the medicine without talking to your doctor first, even if you have not had a seizure in a long time.   2. Avoid activities in which a seizure would cause danger to yourself or to others.  Don't operate dangerous machinery, swim alone, or climb in high or dangerous places, such as on ladders, roofs, or girders.  Do not drive unless your doctor says you may.  3. If you have any warning that you may have a seizure, lay down in a safe place where you can't hurt yourself.    4.  No driving for 6 months from last seizure, [facility]   Please refer to the following link on the [employer] for more information: http://www.epilepsyfoundation.org/answerplace/Social/driving/drivingu.cfm   5.  Maintain good sleep hygiene.Avoid alcohol.  6.  Notify your neurology if you are planning pregnancy or if you become pregnant.  7.  Contact your doctor if you have any problems that may be related to the medicine you are taking.  8.  Call 911 and bring the patient back to the ED if:        A.  The seizure lasts longer than 5 minutes.       B.  The patient doesn't awaken shortly after the seizure  C.  The patient has new problems such as difficulty seeing, speaking or moving  D.  The patient was injured during the seizure  E.  The patient has a temperature over 102 F (39C)  F.  The patient vomited and now is having trouble breathing

## 2022-04-27 NOTE — Progress Notes (Signed)
NEUROLOGY FOLLOW UP OFFICE NOTE  Tehya Journell [id number] June 26, 1995  HISTORY OF PRESENT ILLNESS: I had the pleasure of seeing Briela Mica in follow-up in the neurology clinic on 04/27/2022.  The patient was last seen 3 months ago for intractable epilepsy. She is again accompanied by her father who helps supplement the history today.  Records and images were personally reviewed where available.  On her last visit, Depakote ER was reduced to 761m qhs (2548m3 tabs qhs). She is also on [name] 22m45mhs and Felbetol 600m33mand 1/2 tabs BID without side effects. She is doing well with no seizures since 01/05/21. She has not noticed any difference with reduction in Depakote and would like to continue weaning off. She and her father deny any staring/unresponsive episodes, gaps in time, olfactory/gustatory hallucinations, focal numbness/tingling/weakness, myoclonic jerks. No headaches. She has a little dizziness after medications, no change. Sleep is good. She lives with her mother.    History On Initial Assessment 03/10/2021: This is a 25 y47r old left-handed woman with a history of intractable epilepsy presenting to establish adult epilepsy care. Records from her pediatric neurologist Dr. HickGaynell Facee reviewed and will be summarized as follows. Seizures started at 3 mo45ths of age with myoclonus associated with widening of her eyes. She was initially on Depakote then [facility] 42[facility]4 was weaned off medication. She then started having nocturnal arousals with staring, grunting, and sucking sounds, opening and closing her eyelids for 5 minutes. Head would be turned to the right. She was having an increase in frequency of these episodes [facility], she was discharged on Tegretol and Keppra. Tegretol was discontinued and Felbatol restarted. She had another 6-day EMU admission in January 2003 which  showed rare interictal right hemisphere sharp waves. In December 2009, she had simple and complex partial seizures monthly, usually associated with left-sided jerking and left-Todd's paresis, lasting 5-8 minutes, usually between midnight and 5am. At this point she was on Felbatol and Lamictal. In June 2010, she had an EEG showing frequent high amplitude spike discharges bifrontally that occurred in runs of three to four, more prominent over the right than the left hemisphere particularly at Fp2 and F4.  There was one ictal event after medications were stopped where left leg extended upward and outward, her head deviated tonically to the right, she vocalized and had tonic stiffening of her entire body, followed by clonic activity right greater than left. Seizure started in the left parasagittal region, migrated to the left temporal followed by generalized myogenic artifact, followed by rhythmic 2 Hz spike and wave abnormality prominent over the right hemisphere. Ictal SPECT showed uptake in the left frontal region, it was felt that it was not typical of her seizures. She was discharged on Trileptal and Lamictal but did not tolerate Trileptal and switched back to Felbatol. In 2013, they were reporting more myoclonus. In May 2021, she was started on Vimpat which made her drowsy, [facility] was [facility]. Per Dr. HickMelanee Leftes, FycoSabino Niemann been the most useful medication although she would have occasional drowsiness. Her mother also reported episodes of crying and anger that was blamed on [name], but these began to subside without any change in medication. Majority of her seizures have been nocturnal generalized convulsions that would cause her to fall. [name] was increased to 6mg 60m6/2022.   Her mother reports that major61  her seizures are nocturnal, between 2-4am. They may be in sleep transitions where she is going to the bathroom, one time her mother found a trail of urine from the  hallway to the living room. She has bitten her tongue several times with the seizures. She denies any prior warning/prodrome to the seizures, when she wakes up they feel something always happens on her right side. She denies any myoclonus, olfactory/gustatory hallucinations, focal numbness/tingling/weakness. She denies any headaches. She gets dizzy after taking her night dose of [name], worse when dose was increased to 11m. She denies any diplopia, dysarthria/dysphagia, neck/back pain, bowel/bladder dysfunction. Her mother was letting her manage her own medications for the past couple of years, but found out 3 months ago that she was not taking her medications as prescribed when her mother did a pill count. Since then, she has been using a pillbox and alarm, with no seizures in the past 3 months. They would like to go back to 482mqhs now that she is compliant. She is also on Felbatol 60092m and 1/2 tabs BID and Depakote ER 500m76mtabs qhs. She lives with her parents and stays mostly at home watching TV and social media. She does not drive. She has Nexplanon for birth control. They report "a little depression," she is sleeping a lot, although sleep is interrupted. She will be seeing a therapist.  Epilepsy Risk Factors:  Her paternal grandmother and uncle were diagnosed with seizures. She had a normal birth and early development.  There is no history of febrile convulsions, CNS infections such as meningitis/encephalitis, significant traumatic brain injury, neurosurgical procedures.  Prior ASMs: Tegretol, Trileptal, Keppra, Vimpat, Lamictal, side effects on [name] 6mg 45me  Diagnostic Data: EEGs: Per Dr. HicklMelanee Left: [facility]   [facility] high amplitude spike discharges bifrontally that occurred in runs of three to four, more prominent over the right than the left hemisphere  particularly at Fp2 and F4.  There was one ictal event after medications were stopped where left leg extended upward and outward, her head deviated tonically to the right, she vocalized and had tonic stiffening of her entire body, followed by clonic activity right greater than left. Seizure started in the left parasagittal region, migrated to the left temporal followed by generalized myogenic artifact, followed by rhythmic 2 Hz spike and wave abnormality prominent over the right hemisphere.  Ictal SPECT showed uptake in the left frontal region, it was felt that it was not typical of her seizures."  MRI: MRI with and without contrast in 07/2006 was normal.   PAST MEDICAL HISTORY: Past Medical History:  Diagnosis Date   Seizures (HCC) [place] MEDICATIONS: Current Outpatient Medications on File Prior to Visit  Medication Sig Dispense Refill   divalproex (DEPAKOTE ER) 250 MG 24 hr tablet Take 3 tablets every night 90 tablet 11   felbamate (FELBATOL) 600 MG tablet Take 2 and 1/2 tablets twice daily 150 tablet 5   Perampanel ([name]) 4 MG TABS Take 1 tablet every night 30 tablet 5   No current facility-administered medications on file prior to visit.    ALLERGIES: Allergies  Allergen Reactions   Dust Mite Extract     FAMILY HISTORY: Family History  Problem Relation Age of Onset   Lung cancer Maternal Grandfather        Died at 59   73ncer Paternal Grandmother  Died at 65    SOCIAL HISTORY: Social History   Socioeconomic History   Marital status: Single    Spouse name: Not on file   Number of children: Not on file   Years of education: Not on file   Highest education level: Not on file  Occupational History   Not on file  Tobacco Use   Smoking status: Never    Passive exposure: Yes   Smokeless tobacco: Never   Tobacco comments:    Step father smokes   Vaping Use   Vaping Use: Never used  Substance and Sexual Activity   Alcohol use: No    Alcohol/week: 0.0  standard drinks of alcohol   Drug use: No   Sexual activity: Not Currently    Birth control/protection: None  Other Topics Concern   Not on file  Social History Narrative   Lamae is a 27 yo woman who has graduated. She lives with her boyfriend. She has 2 brothers. She enjoys sleeping, listening to music and talking to friends. Darbie is not currently working or in school. Left hand    Social Determinants of Health   Financial Resource Strain: Not on file  Food Insecurity: Not on file  Transportation Needs: Not on file  Physical Activity: Not on file  Stress: Not on file  Social Connections: Not on file  Intimate Partner Violence: Not on file     PHYSICAL EXAM: Vitals:   04/27/22 1554  BP: 130/86  Pulse: (!) 112  SpO2: 98%   General: No acute distress Head:  Normocephalic/atraumatic Skin/Extremities: No rash, no edema Neurological Exam: alert and awake. No aphasia or dysarthria. Fund of knowledge is appropriate.  Attention and concentration are normal.   Cranial nerves: Pupils equal, round. Extraocular movements intact with no nystagmus. Visual fields full.  No facial asymmetry.  Motor: Bulk and tone normal, muscle strength 5/5 throughout with no pronator drift.   Finger to nose testing intact.  Gait narrow-based and steady, able to tandem walk adequately.  Romberg negative.   IMPRESSION: This is a 27 yo LH woman with intractable epilepsy, etiology unknown. She has had repeated EEGs in the past, with report of right hemisphere sharp waves, as well as bilateral high amplitude spike discharges bifrontally, R>L. Seizure captured in 2010 started in the left parasagittal region. She has been seizure-free since 12/2020. She had a previous discussion with Dr. Gaynell Face regarding weaning off Depakote with addition of [name] and would like to proceed with wean, reduce Depakote ER 222m to 2 tabs qhs for 1 month, then to 1 tab qhs for 1 month, then stop. Continue [name] 433mqhs and  Felbatol 60054m and 1/2 tabs BID. She does not drive. Follow-up in 3 months, she knows to call for any changes.    Thank you for allowing me to participate in her care.  Please do not hesitate to call for any questions or concerns.    KarEllouise Newer.D.   CC: Dr. McKMelford Aase

## 2022-07-27 ENCOUNTER — Encounter: Payer: Self-pay | Admitting: Neurology

## 2022-07-27 ENCOUNTER — Ambulatory Visit (INDEPENDENT_AMBULATORY_CARE_PROVIDER_SITE_OTHER): Payer: Medicaid Other | Admitting: Neurology

## 2022-07-27 VITALS — BP 150/92 | HR 110 | Ht 67.0 in | Wt 211.8 lb

## 2022-07-27 DIAGNOSIS — G40219 Localization-related (focal) (partial) symptomatic epilepsy and epileptic syndromes with complex partial seizures, intractable, without status epilepticus: Secondary | ICD-10-CM

## 2022-07-27 MED ORDER — FYCOMPA 4 MG PO TABS: ORAL_TABLET | ORAL | 5 refills | Status: DC

## 2022-07-27 MED ORDER — FELBAMATE 600 MG PO TABS: ORAL_TABLET | ORAL | 5 refills | Status: DC

## 2022-07-27 NOTE — Progress Notes (Signed)
NEUROLOGY FOLLOW UP OFFICE NOTE  Rebecca Chandler [id number] [birth year]  HISTORY OF PRESENT ILLNESS: I had the pleasure of seeing Rebecca Chandler in follow-up in the neurology clinic on 07/27/2022.  The patient was last seen 3 months ago for intractable epilepsy. She is again accompanied by her father who helps supplement the history today.  Records and images were personally reviewed where available. Since her last visit, she has slowly weaned off Depakote, last dose was a month ago. She continues on Felbamate 600mg  2.5 tabs BID and [name] 4mg  qhs. She denies any seizures or seizure-like symptoms since 01/05/21. No staring/unresponsive episodes, gaps in time, olfactory/gustatory hallucinations, focal numbness/tingling/weakness, myoclonic jerks. No headaches, vision changes. Off the Depakote, the dizziness is less and she is "super alert" per father. She had a fall 2 nights ago getting up too fast to use the bathroom. She tripped and bruised her right side and right arm, no loss of consciousness. Sleep and mood are good.   History On Initial Assessment 03/10/2021: This is a 27 year old left-handed woman with a history of intractable epilepsy presenting to establish adult epilepsy care. Records from her pediatric neurologist Dr. Sharene Skeans were reviewed and will be summarized as follows. Seizures started at 23 months of age with myoclonus associated with widening of her eyes. She was initially on Depakote then switched to [facility], became seizure-free from age 2-4 and was weaned off medication. She then started having nocturnal arousals with staring, grunting, and sucking sounds, opening and closing her eyelids for 5 minutes. Head would be turned to the right. She was having an increase in frequency of these episodes and was evaluated at [facility]. Typical events were not captured, she was discharged on Tegretol and Keppra. Tegretol was discontinued and Felbatol restarted. She had another 6-day  EMU admission in January 2003 which showed rare interictal right hemisphere sharp waves. In December 2009, she had simple and complex partial seizures monthly, usually associated with left-sided jerking and left-Todd's paresis, lasting 5-8 minutes, usually between midnight and 5am. At this point she was on Felbatol and Lamictal. In June 2010, she had an EEG showing frequent high amplitude spike discharges bifrontally that occurred in runs of three to four, more prominent over the right than the left hemisphere particularly at Fp2 and F4.  There was one ictal event after medications were stopped where left leg extended upward and outward, her head deviated tonically to the right, she vocalized and had tonic stiffening of her entire body, followed by clonic activity right greater than left. Seizure started in the left parasagittal region, migrated to the left temporal followed by generalized myogenic artifact, followed by rhythmic 2 Hz spike and wave abnormality prominent over the right hemisphere. Ictal SPECT showed uptake in the left frontal region, it was felt that it was not typical of her seizures. She was discharged on Trileptal and Lamictal but did not tolerate Trileptal and switched back to Felbatol. In 2013, they were reporting more myoclonus. In May 2021, she was started on Vimpat which made her drowsy, she was switched to [facility] which helped better. Per Dr. Darl Householder notes, Rebecca Chandler has been the most useful medication although she would have occasional drowsiness. Her mother also reported episodes of crying and anger that was blamed on [name], but these began to subside without any change in medication. Majority of her seizures have been nocturnal generalized convulsions that would cause her to fall. [name] was increased to 6mg  in 08/2020.   Her mother  reports that majority of her seizures are nocturnal, between 2-4am. They may be in sleep transitions where she is going to the bathroom, one time her  mother found a trail of urine from the hallway to the living room. She has bitten her tongue several times with the seizures. She denies any prior warning/prodrome to the seizures, when she wakes up they feel something always happens on her right side. She denies any myoclonus, olfactory/gustatory hallucinations, focal numbness/tingling/weakness. She denies any headaches. She gets dizzy after taking her night dose of [name], worse when dose was increased to 6mg . She denies any diplopia, dysarthria/dysphagia, neck/back pain, bowel/bladder dysfunction. Her mother was letting her manage her own medications for the past couple of years, but found out 3 months ago that she was not taking her medications as prescribed when her mother did a pill count. Since then, she has been using a pillbox and alarm, with no seizures in the past 3 months. They would like to go back to 4mg  qhs now that she is compliant. She is also on Felbatol 600mg  2 and 1/2 tabs BID and Depakote ER 500mg  2 tabs qhs. She lives with her parents and stays mostly at home watching TV and social media. She does not drive. She has Nexplanon for birth control. They report "a little depression," she is sleeping a lot, although sleep is interrupted. She will be seeing a therapist.  Epilepsy Risk Factors:  Her paternal grandmother and uncle were diagnosed with seizures. She had a normal birth and early development.  There is no history of febrile convulsions, CNS infections such as meningitis/encephalitis, significant traumatic brain injury, neurosurgical procedures.  Prior ASMs: Tegretol, Trileptal, Keppra, Vimpat, Lamictal, side effects on [name] 6mg  dose  Diagnostic Data: EEGs: Per Dr. Darl Householder note: "[facility] EMU January 2003 reported showed rare interictal right hemisphere sharp waves.   [facility] reported frequent high amplitude spike discharges bifrontally that occurred in runs of three to four, more prominent over the  right than the left hemisphere particularly at Fp2 and F4.  There was one ictal event after medications were stopped where left leg extended upward and outward, her head deviated tonically to the right, she vocalized and had tonic stiffening of her entire body, followed by clonic activity right greater than left. Seizure started in the left parasagittal region, migrated to the left temporal followed by generalized myogenic artifact, followed by rhythmic 2 Hz spike and wave abnormality prominent over the right hemisphere.  Ictal SPECT showed uptake in the left frontal region, it was felt that it was not typical of her seizures."  MRI: MRI with and without contrast in 07/2006 was normal.   PAST MEDICAL HISTORY: Past Medical History:  Diagnosis Date   Seizures (HCC)     MEDICATIONS: Current Outpatient Medications on File Prior to Visit  Medication Sig Dispense Refill   felbamate (FELBATOL) 600 MG tablet Take 2 and 1/2 tablets twice daily 150 tablet 5   Perampanel ([name]) 4 MG TABS Take 1 tablet every night 30 tablet 5   No current facility-administered medications on file prior to visit.    ALLERGIES: Allergies  Allergen Reactions   Dust Mite Extract     FAMILY HISTORY: Family History  Problem Relation Age of Onset   Lung cancer Maternal Grandfather        Died at 51   Cancer Paternal Grandmother        Died at 31    SOCIAL HISTORY: Social  History   Socioeconomic History   Marital status: Single    Spouse name: Not on file   Number of children: Not on file   Years of education: Not on file   Highest education level: Not on file  Occupational History   Not on file  Tobacco Use   Smoking status: Never    Passive exposure: Yes   Smokeless tobacco: Never   Tobacco comments:    Step father smokes   Vaping Use   Vaping Use: Never used  Substance and Sexual Activity   Alcohol use: No    Alcohol/week: 0.0 standard drinks of alcohol   Drug use: No   Sexual activity:  Not Currently    Birth control/protection: None  Other Topics Concern   Not on file  Social History Narrative   Rebecca Chandler is a 27 yo woman who has graduated. She lives with her boyfriend. She has 2 brothers. She enjoys sleeping, listening to music and talking to friends. Rebecca Chandler is not currently working or in school. Left hand    Social Determinants of Health   Financial Resource Strain: Not on file  Food Insecurity: Not on file  Transportation Needs: Not on file  Physical Activity: Not on file  Stress: Not on file  Social Connections: Not on file  Intimate Partner Violence: Not on file     PHYSICAL EXAM: Vitals:   07/27/22 1549  BP: (!) 150/92  Pulse: (!) 110  SpO2: 99%   General: No acute distress Head:  Normocephalic/atraumatic Skin/Extremities: No rash, no edema Neurological Exam: alert and awake. No aphasia or dysarthria. Fund of knowledge is appropriate.   Attention and concentration are normal.   Cranial nerves: Pupils equal, round. Extraocular movements intact with no nystagmus. Visual fields full.  No facial asymmetry.  Motor: Bulk and tone normal, muscle strength 5/5 throughout with no pronator drift.   Finger to nose testing intact.  Gait narrow-based and steady, able to tandem walk adequately.  Romberg negative.   IMPRESSION: This is a 27 yo LH woman with intractable epilepsy, etiology unknown. She has had repeated EEGs in the past, with report of right hemisphere sharp waves, as well as bilateral high amplitude spike discharges bifrontally, R>L. Seizure captured in 2010 started in the left parasagittal region. No seizures since 12/2020. She has weaned off Depakote, currently on [name] 4mg  qhs and Felbatol 600mg  2 and 1/2 tabs BID, refills sent. She does not drive. Follow-up in 4 months, call for any changes.   Thank you for allowing me to participate in her care.  Please do not hesitate to call for any questions or concerns.    Patrcia Dolly, M.D.   CC: Dr.  Oneta Rack

## 2022-07-27 NOTE — Patient Instructions (Signed)
Good to see you doing well. Continue Felbamate 600mg : take 2 and 1/2 tablets twice a day and [name] 4mg  every night. Follow-up in 4 months, call for any changes.    Seizure Precautions: 1. If medication has been prescribed for you to prevent seizures, take it exactly as directed.  Do not stop taking the medicine without talking to your doctor first, even if you have not had a seizure in a long time.   2. Avoid activities in which a seizure would cause danger to yourself or to others.  Don't operate dangerous machinery, swim alone, or climb in high or dangerous places, such as on ladders, roofs, or girders.  Do not drive unless your doctor says you may.  3. If you have any warning that you may have a seizure, lay down in a safe place where you can't hurt yourself.    4.  No driving for 6 months from last seizure, as per [facility].   Please refer to the following link on the [employer]ile[employer] of [employer]'s website for more information: http://www.epilepsyfoundation.org/answerplace/Social/driving/drivingu.cfm   5.  Maintain good sleep hygiene. Avoid alcohol.  6.  Notify your neurology if you are planning pregnancy or if you become pregnant.  7.  Contact your doctor if you have any problems that may be related to the medicine you are taking.  8.  Call 911 and bring the patient back to the ED if:        A.  The seizure lasts longer than 5 minutes.       B.  The patient doesn't awaken shortly after the seizure  C.  The patient has new problems such as difficulty seeing, speaking or moving  D.  The patient was injured during the seizure  E.  The patient has a temperature over 102 F (39C)  F.  The patient vomited and now is having trouble breathing

## 2022-08-09 ENCOUNTER — Ambulatory Visit: Payer: Medicaid Other | Admitting: Neurology

## 2022-10-18 ENCOUNTER — Other Ambulatory Visit (HOSPITAL_COMMUNITY): Payer: Self-pay | Admitting: Orthopedic Surgery

## 2022-10-18 ENCOUNTER — Other Ambulatory Visit: Payer: Self-pay

## 2022-10-18 ENCOUNTER — Encounter (HOSPITAL_BASED_OUTPATIENT_CLINIC_OR_DEPARTMENT_OTHER): Payer: Self-pay | Admitting: Orthopedic Surgery

## 2022-10-20 NOTE — Anesthesia Preprocedure Evaluation (Signed)
Anesthesia Evaluation  Patient identified by MRN, date of birth, ID band Patient awake    Reviewed: Allergy & Precautions, NPO status , Patient's Chart, lab work & pertinent test results  History of Anesthesia Complications Negative for: history of anesthetic complications  Airway Mallampati: II  TM Distance: >3 FB Neck ROM: Full    Dental  (+) Dental Advisory Given, Teeth Intact   Pulmonary neg pulmonary ROS   Pulmonary exam normal        Cardiovascular negative cardio ROS Normal cardiovascular exam     Neuro/Psych Seizures -, Well Controlled,  PSYCHIATRIC DISORDERS  Depression     Central auditory processing disorder Mild intellectual disability     GI/Hepatic negative GI ROS, Neg liver ROS,,,  Endo/Other   Obesity   Renal/GU negative Renal ROS     Musculoskeletal negative musculoskeletal ROS (+)    Abdominal   Peds  Hematology negative hematology ROS (+)   Anesthesia Other Findings   Reproductive/Obstetrics                             Anesthesia Physical Anesthesia Plan  ASA: 2  Anesthesia Plan: General   Post-op Pain Management: Regional block* and Tylenol PO (pre-op)*   Induction: Intravenous  PONV Risk Score and Plan: 3 and Treatment may vary due to age or medical condition, Ondansetron, Dexamethasone and Midazolam  Airway Management Planned: LMA  Additional Equipment: None  Intra-op Plan:   Post-operative Plan: Extubation in OR  Informed Consent: I have reviewed the patients History and Physical, chart, labs and discussed the procedure including the risks, benefits and alternatives for the proposed anesthesia with the patient or authorized representative who has indicated his/her understanding and acceptance.     Dental advisory given and Consent reviewed with POA  Plan Discussed with: CRNA and Anesthesiologist  Anesthesia Plan Comments:         Anesthesia Quick Evaluation

## 2022-10-21 ENCOUNTER — Ambulatory Visit (HOSPITAL_BASED_OUTPATIENT_CLINIC_OR_DEPARTMENT_OTHER): Payer: Medicaid Other | Admitting: Anesthesiology

## 2022-10-21 ENCOUNTER — Encounter (HOSPITAL_BASED_OUTPATIENT_CLINIC_OR_DEPARTMENT_OTHER): Payer: Self-pay | Admitting: Orthopedic Surgery

## 2022-10-21 ENCOUNTER — Ambulatory Visit (HOSPITAL_BASED_OUTPATIENT_CLINIC_OR_DEPARTMENT_OTHER): Payer: Medicaid Other

## 2022-10-21 ENCOUNTER — Other Ambulatory Visit: Payer: Self-pay

## 2022-10-21 ENCOUNTER — Ambulatory Visit (HOSPITAL_BASED_OUTPATIENT_CLINIC_OR_DEPARTMENT_OTHER)
Admission: RE | Admit: 2022-10-21 | Discharge: 2022-10-21 | Disposition: A | Discharge status: Home / Self Care | Payer: Medicaid Other | Attending: Orthopedic Surgery | Admitting: Orthopedic Surgery

## 2022-10-21 ENCOUNTER — Encounter (HOSPITAL_BASED_OUTPATIENT_CLINIC_OR_DEPARTMENT_OTHER)
Admission: RE | Disposition: A | Discharge status: Home / Self Care | Payer: Self-pay | Source: Home / Self Care | Attending: Orthopedic Surgery

## 2022-10-21 DIAGNOSIS — S82841A Displaced bimalleolar fracture of right lower leg, initial encounter for closed fracture: Secondary | ICD-10-CM | POA: Diagnosis not present

## 2022-10-21 DIAGNOSIS — G40909 Epilepsy, unspecified, not intractable, without status epilepticus: Secondary | ICD-10-CM | POA: Insufficient documentation

## 2022-10-21 DIAGNOSIS — F70 Mild intellectual disabilities: Secondary | ICD-10-CM | POA: Diagnosis not present

## 2022-10-21 DIAGNOSIS — E669 Obesity, unspecified: Secondary | ICD-10-CM | POA: Diagnosis not present

## 2022-10-21 DIAGNOSIS — Z01818 Encounter for other preprocedural examination: Secondary | ICD-10-CM

## 2022-10-21 DIAGNOSIS — Z6831 Body mass index (BMI) 31.0-31.9, adult: Secondary | ICD-10-CM | POA: Diagnosis not present

## 2022-10-21 DIAGNOSIS — H9325 Central auditory processing disorder: Secondary | ICD-10-CM | POA: Insufficient documentation

## 2022-10-21 DIAGNOSIS — W19XXXA Unspecified fall, initial encounter: Secondary | ICD-10-CM | POA: Insufficient documentation

## 2022-10-21 HISTORY — PX: ORIF ANKLE FRACTURE: SHX5408

## 2022-10-21 HISTORY — DX: Central auditory processing disorder: H93.25

## 2022-10-21 HISTORY — DX: Depression, unspecified: F32.A

## 2022-10-21 LAB — POCT PREGNANCY, URINE: Preg Test, Ur: NEGATIVE

## 2022-10-21 SURGERY — OPEN REDUCTION INTERNAL FIXATION (ORIF) ANKLE FRACTURE
Anesthesia: General | Site: Ankle | Laterality: Right

## 2022-10-21 MED ORDER — PROPOFOL 10 MG/ML IV BOLUS
INTRAVENOUS | Status: AC
  Filled 2022-10-21: qty 20

## 2022-10-21 MED ORDER — 0.9 % SODIUM CHLORIDE (POUR BTL) OPTIME
TOPICAL | Status: DC | PRN
  Administered 2022-10-21: 300 mL

## 2022-10-21 MED ORDER — DOCUSATE SODIUM 100 MG PO CAPS
100.0000 mg | ORAL_CAPSULE | Freq: Two times a day (BID) | ORAL | 0 refills | Status: DC
Start: 2022-10-21 — End: 2022-11-30

## 2022-10-21 MED ORDER — VANCOMYCIN HCL 500 MG IV SOLR
INTRAVENOUS | Status: DC | PRN
  Administered 2022-10-21: 500 mg via TOPICAL

## 2022-10-21 MED ORDER — PROPOFOL 10 MG/ML IV BOLUS
INTRAVENOUS | Status: DC | PRN
Start: 2022-10-21 — End: 2022-10-21
  Administered 2022-10-21: 200 mg via INTRAVENOUS

## 2022-10-21 MED ORDER — MIDAZOLAM HCL 2 MG/2ML IJ SOLN
INTRAMUSCULAR | Status: AC
  Filled 2022-10-21: qty 2

## 2022-10-21 MED ORDER — CEFAZOLIN SODIUM-DEXTROSE 2-4 GM/100ML-% IV SOLN
INTRAVENOUS | Status: AC
  Filled 2022-10-21: qty 100

## 2022-10-21 MED ORDER — MIDAZOLAM HCL 2 MG/2ML IJ SOLN
2.0000 mg | Freq: Once | INTRAMUSCULAR | Status: AC
  Administered 2022-10-21: 2 mg via INTRAVENOUS

## 2022-10-21 MED ORDER — ACETAMINOPHEN 500 MG PO TABS
1000.0000 mg | ORAL_TABLET | Freq: Once | ORAL | Status: AC
  Administered 2022-10-21: 1000 mg via ORAL

## 2022-10-21 MED ORDER — ACETAMINOPHEN 500 MG PO TABS
ORAL_TABLET | ORAL | Status: AC
  Filled 2022-10-21: qty 2

## 2022-10-21 MED ORDER — LACTATED RINGERS IV SOLN: INTRAVENOUS | Status: DC

## 2022-10-21 MED ORDER — PROMETHAZINE HCL 25 MG/ML IJ SOLN: 6.2500 mg | INTRAMUSCULAR | Status: DC | PRN

## 2022-10-21 MED ORDER — ASPIRIN 81 MG PO TBEC: 81.0000 mg | DELAYED_RELEASE_TABLET | Freq: Two times a day (BID) | ORAL | 0 refills | Status: DC

## 2022-10-21 MED ORDER — ONDANSETRON HCL 4 MG/2ML IJ SOLN
INTRAMUSCULAR | Status: DC | PRN
Start: 2022-10-21 — End: 2022-10-21
  Administered 2022-10-21: 4 mg via INTRAVENOUS

## 2022-10-21 MED ORDER — BUPIVACAINE-EPINEPHRINE (PF) 0.5% -1:200000 IJ SOLN
INTRAMUSCULAR | Status: DC | PRN
  Administered 2022-10-21: 25 mL via PERINEURAL
  Administered 2022-10-21: 15 mL via PERINEURAL

## 2022-10-21 MED ORDER — OXYCODONE HCL 5 MG PO TABS: 5.0000 mg | ORAL_TABLET | ORAL | 0 refills | Status: AC | PRN

## 2022-10-21 MED ORDER — FENTANYL CITRATE (PF) 100 MCG/2ML IJ SOLN: 25.0000 ug | INTRAMUSCULAR | Status: DC | PRN

## 2022-10-21 MED ORDER — SENNA 8.6 MG PO TABS
2.0000 | ORAL_TABLET | Freq: Two times a day (BID) | ORAL | 0 refills | Status: DC
Start: 2022-10-21 — End: 2022-11-30

## 2022-10-21 MED ORDER — OXYCODONE HCL 5 MG/5ML PO SOLN: 5.0000 mg | Freq: Once | ORAL | Status: DC | PRN

## 2022-10-21 MED ORDER — FENTANYL CITRATE (PF) 100 MCG/2ML IJ SOLN
INTRAMUSCULAR | Status: AC
  Filled 2022-10-21: qty 2

## 2022-10-21 MED ORDER — FENTANYL CITRATE (PF) 100 MCG/2ML IJ SOLN
100.0000 ug | Freq: Once | INTRAMUSCULAR | Status: AC
  Administered 2022-10-21: 100 ug via INTRAVENOUS

## 2022-10-21 MED ORDER — MIDAZOLAM HCL 2 MG/2ML IJ SOLN
INTRAMUSCULAR | Status: DC | PRN
  Administered 2022-10-21: 1 mg via INTRAVENOUS

## 2022-10-21 MED ORDER — DEXAMETHASONE SODIUM PHOSPHATE 10 MG/ML IJ SOLN
INTRAMUSCULAR | Status: DC | PRN
  Administered 2022-10-21: 5 mg via INTRAVENOUS

## 2022-10-21 MED ORDER — CEFAZOLIN SODIUM-DEXTROSE 2-4 GM/100ML-% IV SOLN
2.0000 g | INTRAVENOUS | Status: AC
  Administered 2022-10-21: 2 g via INTRAVENOUS

## 2022-10-21 MED ORDER — LIDOCAINE 2% (20 MG/ML) 5 ML SYRINGE
INTRAMUSCULAR | Status: DC | PRN
  Administered 2022-10-21: 60 mg via INTRAVENOUS

## 2022-10-21 MED ORDER — SODIUM CHLORIDE 0.9 % IV SOLN: INTRAVENOUS | Status: DC

## 2022-10-21 MED ORDER — OXYCODONE HCL 5 MG PO TABS: 5.0000 mg | ORAL_TABLET | Freq: Once | ORAL | Status: DC | PRN

## 2022-10-21 MED ORDER — LIDOCAINE 2% (20 MG/ML) 5 ML SYRINGE
INTRAMUSCULAR | Status: AC
  Filled 2022-10-21: qty 5

## 2022-10-21 SURGICAL SUPPLY — 78 items
APL PRP STRL LF DISP 70% ISPRP (MISCELLANEOUS) ×1
BANDAGE ESMARK 6X9 LF (GAUZE/BANDAGES/DRESSINGS) IMPLANT
BIT DRILL 2.5X2.75 QC CALB (BIT) IMPLANT
BIT DRILL 2.9 CANN QC NONSTRL (BIT) IMPLANT
BIT DRILL 3.5X5.5 QC CALB (BIT) IMPLANT
BLADE SURG 15 STRL LF DISP TIS (BLADE) ×2 IMPLANT
BLADE SURG 15 STRL SS (BLADE) ×2
BNDG CMPR 5X4 KNIT ELC UNQ LF (GAUZE/BANDAGES/DRESSINGS) ×1
BNDG CMPR 6 X 5 YARDS HK CLSR (GAUZE/BANDAGES/DRESSINGS) ×1
BNDG CMPR 9X4 STRL LF SNTH (GAUZE/BANDAGES/DRESSINGS)
BNDG CMPR 9X6 STRL LF SNTH (GAUZE/BANDAGES/DRESSINGS)
BNDG ELASTIC 4INX 5YD STR LF (GAUZE/BANDAGES/DRESSINGS) ×1 IMPLANT
BNDG ELASTIC 6INX 5YD STR LF (GAUZE/BANDAGES/DRESSINGS) ×1 IMPLANT
BNDG ESMARK 4X9 LF (GAUZE/BANDAGES/DRESSINGS) IMPLANT
BNDG ESMARK 6X9 LF (GAUZE/BANDAGES/DRESSINGS)
CANISTER SUCT 1200ML W/VALVE (MISCELLANEOUS) ×1 IMPLANT
CHLORAPREP W/TINT 26 (MISCELLANEOUS) ×1 IMPLANT
COVER BACK TABLE 60X90IN (DRAPES) ×1 IMPLANT
CUFF TOURN SGL QUICK 34 (TOURNIQUET CUFF)
CUFF TRNQT CYL 34X4.125X (TOURNIQUET CUFF) IMPLANT
DRAPE EXTREMITY T 121X128X90 (DISPOSABLE) ×1 IMPLANT
DRAPE OEC MINIVIEW 54X84 (DRAPES) ×1 IMPLANT
DRAPE U-SHAPE 47X51 STRL (DRAPES) ×1 IMPLANT
DRSG MEPITEL 4X7.2 (GAUZE/BANDAGES/DRESSINGS) ×1 IMPLANT
ELECT REM PT RETURN 9FT ADLT (ELECTROSURGICAL) ×1
ELECTRODE REM PT RTRN 9FT ADLT (ELECTROSURGICAL) ×1 IMPLANT
GAUZE PAD ABD 8X10 STRL (GAUZE/BANDAGES/DRESSINGS) ×2 IMPLANT
GAUZE SPONGE 4X4 12PLY STRL (GAUZE/BANDAGES/DRESSINGS) ×1 IMPLANT
GLOVE BIO SURGEON STRL SZ 6.5 (GLOVE) IMPLANT
GLOVE BIO SURGEON STRL SZ8 (GLOVE) ×1 IMPLANT
GLOVE BIOGEL PI IND STRL 6.5 (GLOVE) IMPLANT
GLOVE BIOGEL PI IND STRL 7.0 (GLOVE) IMPLANT
GLOVE BIOGEL PI IND STRL 8 (GLOVE) ×2 IMPLANT
GLOVE ECLIPSE 8.0 STRL XLNG CF (GLOVE) ×1 IMPLANT
GLOVE SURG SS PI 7.0 STRL IVOR (GLOVE) IMPLANT
GOWN STRL REUS W/ TWL LRG LVL3 (GOWN DISPOSABLE) ×1 IMPLANT
GOWN STRL REUS W/ TWL XL LVL3 (GOWN DISPOSABLE) ×2 IMPLANT
GOWN STRL REUS W/TWL LRG LVL3 (GOWN DISPOSABLE) ×2
GOWN STRL REUS W/TWL XL LVL3 (GOWN DISPOSABLE) ×2
K-WIRE ACE 1.6X6 (WIRE) ×2
KWIRE ACE 1.6X6 (WIRE) IMPLANT
NDL HYPO 22X1.5 SAFETY MO (MISCELLANEOUS) IMPLANT
NEEDLE HYPO 22X1.5 SAFETY MO (MISCELLANEOUS) IMPLANT
NS IRRIG 1000ML POUR BTL (IV SOLUTION) ×1 IMPLANT
PACK BASIN DAY SURGERY FS (CUSTOM PROCEDURE TRAY) ×1 IMPLANT
PAD CAST 4YDX4 CTTN HI CHSV (CAST SUPPLIES) ×1 IMPLANT
PADDING CAST ABS COTTON 4X4 ST (CAST SUPPLIES) IMPLANT
PADDING CAST COTTON 4X4 STRL (CAST SUPPLIES) ×1
PADDING CAST COTTON 6X4 STRL (CAST SUPPLIES) ×1 IMPLANT
PENCIL SMOKE EVACUATOR (MISCELLANEOUS) ×1 IMPLANT
PLATE ACE 100DEG 7HOLE (Plate) IMPLANT
SANITIZER HAND PURELL FF 515ML (MISCELLANEOUS) ×1 IMPLANT
SCREW ACE CAN 4.0 40M (Screw) IMPLANT
SCREW CORTICAL 3.5MM 10MM (Screw) IMPLANT
SCREW CORTICAL 3.5MM 16MM (Screw) IMPLANT
SCREW CORTICAL 3.5MM 18MM (Screw) IMPLANT
SCREW CORTICAL 3.5MM 20MM (Screw) IMPLANT
SCREW CORTICAL 3.5MM 22MM (Screw) IMPLANT
SCREW CORTICAL 3.5MM 30MM (Screw) IMPLANT
SHEET MEDIUM DRAPE 40X70 STRL (DRAPES) ×1 IMPLANT
SLEEVE SCD COMPRESS KNEE MED (STOCKING) ×1 IMPLANT
SPIKE FLUID TRANSFER (MISCELLANEOUS) IMPLANT
SPLINT PLASTER CAST FAST 5X30 (CAST SUPPLIES) ×20 IMPLANT
SPONGE T-LAP 18X18 ~~LOC~~+RFID (SPONGE) ×1 IMPLANT
STOCKINETTE 6 STRL (DRAPES) ×1 IMPLANT
SUCTION TUBE FRAZIER 10FR DISP (SUCTIONS) ×1 IMPLANT
SUT ETHILON 3 0 PS 1 (SUTURE) ×1 IMPLANT
SUT FIBERWIRE #2 38 T-5 BLUE (SUTURE)
SUT MNCRL AB 3-0 PS2 18 (SUTURE) IMPLANT
SUT VIC AB 2-0 SH 27 (SUTURE) ×1
SUT VIC AB 2-0 SH 27XBRD (SUTURE) ×1 IMPLANT
SUT VICRYL 0 SH 27 (SUTURE) IMPLANT
SUTURE FIBERWR #2 38 T-5 BLUE (SUTURE) IMPLANT
SYR BULB EAR ULCER 3OZ GRN STR (SYRINGE) ×1 IMPLANT
SYR CONTROL 10ML LL (SYRINGE) IMPLANT
TOWEL GREEN STERILE FF (TOWEL DISPOSABLE) ×2 IMPLANT
TUBE CONNECTING 20X1/4 (TUBING) ×1 IMPLANT
UNDERPAD 30X36 HEAVY ABSORB (UNDERPADS AND DIAPERS) ×1 IMPLANT

## 2022-10-21 NOTE — Anesthesia Procedure Notes (Signed)
Anesthesia Regional Block: Popliteal block   Pre-Anesthetic Checklist: , timeout performed,  Correct Patient, Correct Site, Correct Laterality,  Correct Procedure, Correct Position, site marked,  Risks and benefits discussed,  Surgical consent,  Pre-op evaluation,  At surgeon's request and post-op pain management  Laterality: Right  Prep: chloraprep       Needles:  Injection technique: Single-shot  Needle Type: Echogenic Needle     Needle Length: 10cm  Needle Gauge: 21     Additional Needles:   Narrative:  Start time: 10/21/2022 9:00 AM End time: 10/21/2022 9:03 AM Injection made incrementally with aspirations every 5 mL.  Performed by: Personally  Anesthesiologist: Beryle Lathe, MD  Additional Notes: No pain on injection. No increased resistance to injection. Injection made in 5cc increments. Good needle visualization. Patient tolerated the procedure well.

## 2022-10-21 NOTE — Op Note (Signed)
10/21/2022  10:18 AM  PATIENT:  Rebecca Chandler  27 y.o. female  PRE-OPERATIVE DIAGNOSIS:  closed bimalleolar fracture of right ankle  POST-OPERATIVE DIAGNOSIS:  closed bimalleolar fracture of right ankle  Procedure(s): 1.  Open treatment right ankle bimalleolar fracture with internal fixation 2.  Stress examination of the right ankle under fluoroscopy 3.  AP, mortise and lateral radiographs of the right ankle  SURGEON:  Toni Arthurs, MD  ASSISTANT: Alfredo Martinez, PA-C  ANESTHESIA:   General, regional  EBL:  minimal   TOURNIQUET: 30 minutes at 250 mmHg thigh  COMPLICATIONS:  None apparent  DISPOSITION:  Extubated, awake and stable to recovery.  INDICATION FOR PROCEDURE: 27 year old female fell a week ago injuring her right ankle.  Radiographs reveal a bimalleolar ankle fracture that is displaced.  She presents now for operative treatment of this displaced and unstable right ankle injury.  The risks and benefits of the alternative treatment options have been discussed in detail.  The patient wishes to proceed with surgery and specifically understands risks of bleeding, infection, nerve damage, blood clots, need for additional surgery, amputation and death.   PROCEDURE IN DETAIL:  After pre operative consent was obtained, and the correct operative site was identified, the patient was brought to the operating room and placed supine on the OR table.  Anesthesia was administered.  Pre-operative antibiotics were administered.  A surgical timeout was taken.  The right lower extremity was prepped and draped in standard sterile fashion with a tourniquet around the thigh.  The extremity was elevated, and the tourniquet was inflated to 250 mmHg.  A longitudinal incision was made over the lateral malleolus.  Dissection was carried sharply down through the subcutaneous tissues.  The fracture site was cleaned of all hematoma and irrigated copiously.  The fracture was reduced and held with a  tenaculum proximally.  The [place] tubercle was noted to be avulsed.  It was reduced and held with a second tenaculum.  A 3.5 mm fully threaded cortical lag screw was inserted from posterior to anterior across the primary fracture line.  It was noted to have excellent purchase and compressed the fracture site appropriately.  A second screw was placed anterior to posterior securing the [place] fragment.  A 7 hole one third tubular plate was then contoured to fit the lateral malleolus.  It was secured proximally with 3 bicortical screws and distally with 3 unicortical screws.  AP and lateral radiographs confirmed appropriate position and length of all hardware and appropriate reduction of the lateral malleolus fracture.  Attention was turned to the medial side of the ankle where a longitudinal incision was made over the medial malleolus.  Dissection was carried sharply down through the subcutaneous tissues.  The fracture site was identified.  It was cleaned of all hematoma and periosteum.  It was reduced and held provisionally with a tenaculum.  Radiographs confirmed appropriate reduction.  2 guidepins were then inserted across the fracture site.  They were overdrilled.  4 mm x 40 mm partially-threaded cannulated screws were inserted.  Both were noted to have excellent purchase.  Final AP, mortise and lateral radiographs confirmed appropriate position and length of all hardware and appropriate reduction of the medial and lateral malleolus fractures.  A stress examination was then performed.  Dorsiflexion and external rotation stress was applied to the ankle while live fluoroscopic views in the mortise projection were obtained.  There was no evidence of syndesmosis injury.  The wounds were irrigated copiously and sprinkled with vancomycin  powder.  Subcutaneous tissues were approximated with inverted simple sutures of 2-0 Vicryl.  Skin incisions were closed with running 3-0 nylon.  Sterile dressings were applied  followed by a well-padded short leg splint.  The tourniquet was released after application of the dressings.  The patient was awakened from anesthesia and transported to the recovery room in stable condition.  FOLLOW UP PLAN: Nonweightbearing on the right lower extremity.  Aspirin for DVT prophylaxis.  Follow-up in the office in 2 weeks for suture removal and conversion to a short leg cast.  Plan 6 weeks postoperative nonweightbearing immobilization.   RADIOGRAPHS: AP, mortise and lateral radiographs of the right ankle are obtained intraoperatively.  These show interval reduction and fixation of medial and lateral malleolus fractures.  Hardware is appropriately positioned and of the appropriate lengths.  No other acute injuries are noted.    Alfredo Martinez PA-C was present and scrubbed for the duration of the operative case. His assistance was essential in positioning the patient, prepping and draping, gaining and maintaining exposure, performing the operation, closing and dressing the wounds and applying the splint.

## 2022-10-21 NOTE — Anesthesia Postprocedure Evaluation (Signed)
Anesthesia Post Note  Patient: Rebecca Chandler  Procedure(s) Performed: OPEN REDUCTION INTERNAL FIXATION (ORIF) BIMALLEOLAR ANKLE FRACTURE (Right: Ankle)     Patient location during evaluation: PACU Anesthesia Type: General Level of consciousness: awake and alert Pain management: pain level controlled Vital Signs Assessment: post-procedure vital signs reviewed and stable Respiratory status: spontaneous breathing, nonlabored ventilation and respiratory function stable Cardiovascular status: stable and blood pressure returned to baseline Anesthetic complications: no   No notable events documented.  Last Vitals:  Vitals:   10/21/22 1055 10/21/22 1107  BP: 126/88 123/79  Pulse: 80 88  Resp: 12 14  Temp: 36.4 C 36.6 C  SpO2: 99% 99%    Last Pain:  Vitals:   10/21/22 1107  TempSrc:   PainSc: 0-No pain                 Beryle Lathe

## 2022-10-21 NOTE — Transfer of Care (Signed)
Immediate Anesthesia Transfer of Care Note  Patient: Rebecca Chandler  Procedure(s) Performed: Procedure(s) (LRB): OPEN REDUCTION INTERNAL FIXATION (ORIF) BIMALLEOLAR ANKLE FRACTURE (Right)  Patient Location: PACU  Anesthesia Type: General  Level of Consciousness: awake, oriented, sedated and patient cooperative  Airway & Oxygen Therapy: Patient Spontanous Breathing and Patient connected to nasal cannula oxygen  Post-op Assessment: Report given to PACU RN and Post -op Vital signs reviewed and stable  Post vital signs: Reviewed and stable  Complications: No apparent anesthesia complications  Last Vitals:  Vitals Value Taken Time  BP 114/74 10/21/22 1030  Temp 36.1 C 10/21/22 1025  Pulse 79 10/21/22 1031  Resp 14 10/21/22 1031  SpO2 100 % 10/21/22 1031  Vitals shown include unfiled device data.  Last Pain:  Vitals:   10/21/22 1025  TempSrc:   PainSc: Asleep      Patients Stated Pain Goal: 3 (10/21/22 0752)  Complications: No notable events documented.

## 2022-10-21 NOTE — Progress Notes (Signed)
Assisted Dr. Brock with right, adductor canal, popliteal, ultrasound guided block. Side rails up, monitors on throughout procedure. See vital signs in flow sheet. Tolerated Procedure well. 

## 2022-10-21 NOTE — Anesthesia Procedure Notes (Signed)
Anesthesia Regional Block: Adductor canal block   Pre-Anesthetic Checklist: , timeout performed,  Correct Patient, Correct Site, Correct Laterality,  Correct Procedure, Correct Position, site marked,  Risks and benefits discussed,  Surgical consent,  Pre-op evaluation,  At surgeon's request and post-op pain management  Laterality: Right  Prep: chloraprep       Needles:  Injection technique: Single-shot  Needle Type: Echogenic Needle     Needle Length: 10cm  Needle Gauge: 21     Additional Needles:   Narrative:  Start time: 10/21/2022 8:55 AM End time: 10/21/2022 8:58 AM Injection made incrementally with aspirations every 5 mL.  Performed by: Personally  Anesthesiologist: Beryle Lathe, MD  Additional Notes: No pain on injection. No increased resistance to injection. Injection made in 5cc increments. Good needle visualization. Patient tolerated the procedure well.

## 2022-10-21 NOTE — Discharge Instructions (Addendum)
Toni Arthurs, MD [facility]  Please read the following information regarding your care after surgery.  Medications  You only need a prescription for the narcotic pain medicine (ex. oxycodone, Percocet, Norco).  All of the other medicines listed below are available over the counter. ? Aleve 2 pills twice a day for the first 3 days after surgery. ? acetominophen (Tylenol) 650 mg every 4-6 hours as you need for minor to moderate pain ? oxycodone as prescribed for severe pain  Narcotic pain medicine (ex. oxycodone, Percocet, Vicodin) will cause constipation.  To prevent this problem, take the following medicines while you are taking any pain medicine. ? docusate sodium (Colace) 100 mg twice a day ? senna (Senokot) 2 tablets twice a day   Post Anesthesia Home Care Instructions  Activity: Get plenty of rest for the remainder of the day. A responsible individual must stay with you for 24 hours following the procedure.  For the next 24 hours, DO NOT: -Drive a car -Advertising copywriter -Drink alcoholic beverages -Take any medication unless instructed by your physician -Make any legal decisions or sign important papers.  Meals: Start with liquid foods such as gelatin or soup. Progress to regular foods as tolerated. Avoid greasy, spicy, heavy foods. If nausea and/or vomiting occur, drink only clear liquids until the nausea and/or vomiting subsides. Call your physician if vomiting continues.  Special Instructions/Symptoms: Your throat may feel dry or sore from the anesthesia or the breathing tube placed in your throat during surgery. If this causes discomfort, gargle with warm salt water. The discomfort should disappear within 24 hours.  If you had a scopolamine patch placed behind your ear for the management of post- operative nausea and/or vomiting:  1. The medication in the patch is effective for 72 hours, after which it should be removed.  Wrap patch in a tissue and discard in the trash. Wash  hands thoroughly with soap and water. 2. You may remove the patch earlier than 72 hours if you experience unpleasant side effects which may include dry mouth, dizziness or visual disturbances. 3. Avoid touching the patch. Wash your hands with soap and water after contact with the patch.    Regional Anesthesia Blocks  1. You may not be able to move or feel the "blocked" extremity after a regional anesthetic block. This may last may last from 3-48 hours after placement, but it will go away. The length of time depends on the medication injected and your individual response to the medication. As the nerves start to wake up, you may experience tingling as the movement and feeling returns to your extremity. If the numbness and inability to move your extremity has not gone away after 48 hours, please call your surgeon.   2. The extremity that is blocked will need to be protected until the numbness is gone and the strength has returned. Because you cannot feel it, you will need to take extra care to avoid injury. Because it may be weak, you may have difficulty moving it or using it. You may not know what position it is in without looking at it while the block is in effect.  3. For blocks in the legs and feet, returning to weight bearing and walking needs to be done carefully. You will need to wait until the numbness is entirely gone and the strength has returned. You should be able to move your leg and foot normally before you try and bear weight or walk. You will need someone to be with  you when you first try to ensure you do not fall and possibly risk injury.  4. Bruising and tenderness at the needle site are common side effects and will resolve in a few days.  5. Persistent numbness or new problems with movement should be communicated to the surgeon or the [facility] [phone] [facility] [phone]).  *May have Tylenol today at 2pm  ? To help prevent blood clots, take  a baby aspirin (81 mg) twice a day for six weeks after surgery.  You should also get up every hour while you are awake to move around.    Weight Bearing ? Do not bear any weight on the operated leg or foot.  Cast / Splint / Dressing ? Keep your splint, cast or dressing clean and dry.  Don't put anything (coat hanger, pencil, etc) down inside of it.  If it gets damp, use a hair dryer on the cool setting to dry it.  If it gets soaked, call the office to schedule an appointment for a cast change.   After your dressing, cast or splint is removed; you may shower, but do not soak or scrub the wound.  Allow the water to run over it, and then gently pat it dry.  Swelling It is normal for you to have swelling where you had surgery.  To reduce swelling and pain, keep your toes above your nose for at least 3 days after surgery.  It may be necessary to keep your foot or leg elevated for several weeks.  If it hurts, it should be elevated.  Follow Up Call my office at [phone] when you are discharged from the hospital or surgery center to schedule an appointment to be seen two weeks after surgery.  Call my office at [phone] if you develop a fever >101.5 F, nausea, vomiting, bleeding from the surgical site or severe pain.

## 2022-10-21 NOTE — Anesthesia Procedure Notes (Signed)
Procedure Name: LMA Insertion Date/Time: 10/21/2022 9:31 AM  Performed by: Francie Massing, CRNAPre-anesthesia Checklist: Patient identified, Emergency Drugs available, Suction available and Patient being monitored Patient Re-evaluated:Patient Re-evaluated prior to induction Oxygen Delivery Method: Circle system utilized Preoxygenation: Pre-oxygenation with 100% oxygen Induction Type: IV induction Ventilation: Mask ventilation without difficulty LMA: LMA inserted LMA Size: 4.0 Number of attempts: 1 Airway Equipment and Method: Bite block Placement Confirmation: positive ETCO2 Tube secured with: Tape Dental Injury: Teeth and Oropharynx as per pre-operative assessment

## 2022-10-21 NOTE — H&P (Signed)
Rebecca Chandler is an 27 y.o. female.   Chief Complaint: right ankle pain HPI: 27 y/o female with PMH of a seizure disorder fell last week at home injuring her right ankle.  She has a bimal displaced bimal fracture and presents today for surgical treatment.  Past Medical History:  Diagnosis Date   Central auditory processing disorder    Depression    Seizures (HCC)     Past Surgical History:  Procedure Laterality Date   URETHROPLASTY  10/01/1994    Family History  Problem Relation Age of Onset   Lung cancer Maternal Grandfather        Died at 54   Cancer Paternal Grandmother        Died at 43   Social History:  reports that she has never smoked. She has been exposed to tobacco smoke. She has never used smokeless tobacco. She reports that she does not drink alcohol and does not use drugs.  Allergies:  Allergies  Allergen Reactions   Dust Mite Extract     Medications Prior to Admission  Medication Sig Dispense Refill   felbamate (FELBATOL) 600 MG tablet Take 2 and 1/2 tablets twice daily 150 tablet 5   Perampanel ([name]) 4 MG TABS Take 1 tablet every night 30 tablet 5    No results found for this or any previous visit (from the past 48 hour(s)). No results found.  Review of Systems  no recent f/c/n/v/wt loss  Height 5\' 7"  (1.702 m), weight 93 kg, last menstrual period 10/04/2022. Physical Exam  Wn wd woman in nad.  A and O.  EOMI.  Resp unlabored.  Right ankle swollen.  Intact sens to LT dorsal and plantar forefoot.  Active PF and DF ROM at the toes.  Brisk cap refill at the toes.  Assessment/Plan Right ankle bimal fracture - to the OR for ORIF.  The risks and benefits of the alternative treatment options have been discussed in detail.  The patient wishes to proceed with surgery and specifically understands risks of bleeding, infection, nerve damage, blood clots, need for additional surgery, amputation and death.   Toni Arthurs, MD 2022-11-15, 7:18 AM

## 2022-10-22 ENCOUNTER — Encounter (HOSPITAL_BASED_OUTPATIENT_CLINIC_OR_DEPARTMENT_OTHER): Payer: Self-pay | Admitting: Orthopedic Surgery

## 2022-11-30 ENCOUNTER — Ambulatory Visit: Payer: Medicaid Other | Admitting: Neurology

## 2022-11-30 ENCOUNTER — Encounter: Payer: Self-pay | Admitting: Neurology

## 2022-11-30 DIAGNOSIS — G40219 Localization-related (focal) (partial) symptomatic epilepsy and epileptic syndromes with complex partial seizures, intractable, without status epilepticus: Secondary | ICD-10-CM | POA: Diagnosis not present

## 2022-11-30 MED ORDER — FELBAMATE 600 MG PO TABS
ORAL_TABLET | ORAL | 3 refills | Status: DC
Start: 2022-11-30 — End: 2022-11-30

## 2022-11-30 MED ORDER — FELBAMATE 600 MG PO TABS
ORAL_TABLET | ORAL | 3 refills | Status: DC
Start: 2022-11-30 — End: 2023-04-12

## 2022-11-30 MED ORDER — FYCOMPA 4 MG PO TABS
ORAL_TABLET | ORAL | 3 refills | Status: DC
Start: 2022-11-30 — End: 2023-04-27

## 2022-11-30 NOTE — Progress Notes (Signed)
NEUROLOGY FOLLOW UP OFFICE NOTE  Rebecca Chandler [id number] 1996/02/04  HISTORY OF PRESENT ILLNESS: I had the pleasure of seeing Rebecca Chandler in follow-up in the neurology clinic on 11/30/2022.  The patient was last seen 4 months ago for intractable epilepsy. She is again accompanied by her mother who helps supplement the history today.  Records and images were personally reviewed where available.  Since her last visit, she continues to do well seizure-free since 01/05/2021. She is on Felbamate 600mg  2.5 tabs BID and [name] 4mg  at bedtime without side effects. She weaned off Depakote in April 2024 with no issues. Her mother was concerned she had a seizure yesterday when she woke up and eyes were wide open, she was staring at the closet but she was responding to questions, no jerking or mouth automatisms. Rebecca Chandler reports she was aware the entire time. No other similar instances. She denies any olfactory/gustatory hallucinations, focal numbness/tingling/weakness, myoclonic jerks. No headaches, dizziness, vision changes. She rolled her right ankle getting out of bed in 10/2022 and was found to have a fracture, underwent surgery on 8/15. Cast comes off tomorrow then she will be in a boot. She gets 6-7 hours of sleep. Mood is good.   History On Initial Assessment 03/10/2021: This is a 27 year old left-handed woman with a history of intractable epilepsy presenting to establish adult epilepsy care. Records from her pediatric neurologist Dr. Sharene Skeans were reviewed and will be summarized as follows. Seizures started at 59 months of age with myoclonus associated with widening of her eyes. She was initially on Depakote then switched to [facility], became seizure-free from age 89-4 and was weaned off medication. She then started having nocturnal arousals with staring, grunting, and sucking sounds, opening and closing her eyelids for 5 minutes. Head would be turned to the right. She was having an increase in  frequency of these episodes and was evaluated at [facility]. Typical events were not captured, she was discharged on Tegretol and Keppra. Tegretol was discontinued and Felbatol restarted. She had another 6-day EMU admission in January 2003 which showed rare interictal right hemisphere sharp waves. In December 2009, she had simple and complex partial seizures monthly, usually associated with left-sided jerking and left-Todd's paresis, lasting 5-8 minutes, usually between midnight and 5am. At this point she was on Felbatol and Lamictal. In June 2010, she had an EEG showing frequent high amplitude spike discharges bifrontally that occurred in runs of three to four, more prominent over the right than the left hemisphere particularly at Fp2 and F4.  There was one ictal event after medications were stopped where left leg extended upward and outward, her head deviated tonically to the right, she vocalized and had tonic stiffening of her entire body, followed by clonic activity right greater than left. Seizure started in the left parasagittal region, migrated to the left temporal followed by generalized myogenic artifact, followed by rhythmic 2 Hz spike and wave abnormality prominent over the right hemisphere. Ictal SPECT showed uptake in the left frontal region, it was felt that it was not typical of her seizures. She was discharged on Trileptal and Lamictal but did not tolerate Trileptal and switched back to Felbatol. In 2013, they were reporting more myoclonus. In May 2021, she was started on Vimpat which made her drowsy, she was switched to [facility] which helped better. Per Dr. Darl Householder notes, Rebecca Chandler has been the most useful medication although she would have occasional drowsiness. Her mother also reported episodes of crying and anger  that was blamed on [name], but these began to subside without any change in medication. Majority of her seizures have been nocturnal generalized convulsions that would cause her to  fall. [name] was increased to 6mg  in 08/2020.   Her mother reports that majority of her seizures are nocturnal, between 2-4am. They may be in sleep transitions where she is going to the bathroom, one time her mother found a trail of urine from the hallway to the living room. She has bitten her tongue several times with the seizures. She denies any prior warning/prodrome to the seizures, when she wakes up they feel something always happens on her right side. She denies any myoclonus, olfactory/gustatory hallucinations, focal numbness/tingling/weakness. She denies any headaches. She gets dizzy after taking her night dose of [name], worse when dose was increased to 6mg . She denies any diplopia, dysarthria/dysphagia, neck/back pain, bowel/bladder dysfunction. Her mother was letting her manage her own medications for the past couple of years, but found out 3 months ago that she was not taking her medications as prescribed when her mother did a pill count. Since then, she has been using a pillbox and alarm, with no seizures in the past 3 months. They would like to go back to 4mg  qhs now that she is compliant. She is also on Felbatol 600mg  2 and 1/2 tabs BID and Depakote ER 500mg  2 tabs qhs. She lives with her parents and stays mostly at home watching TV and social media. She does not drive. She has Nexplanon for birth control. They report "a little depression," she is sleeping a lot, although sleep is interrupted. She will be seeing a therapist.  Epilepsy Risk Factors:  Her paternal grandmother and uncle were diagnosed with seizures. She had a normal birth and early development.  There is no history of febrile convulsions, CNS infections such as meningitis/encephalitis, significant traumatic brain injury, neurosurgical procedures.  Prior ASMs: Tegretol, Trileptal, Keppra, Vimpat, Lamictal, side effects on [name] 6mg  dose  Diagnostic Data: EEGs: Per Dr. Darl Householder note: "[facility] EMU January 2003 reported  showed rare interictal right hemisphere sharp waves.   [facility] EMU in June 2010 reported frequent high amplitude spike discharges bifrontally that occurred in runs of three to four, more prominent over the right than the left hemisphere particularly at Fp2 and F4.  There was one ictal event after medications were stopped where left leg extended upward and outward, her head deviated tonically to the right, she vocalized and had tonic stiffening of her entire body, followed by clonic activity right greater than left. Seizure started in the left parasagittal region, migrated to the left temporal followed by generalized myogenic artifact, followed by rhythmic 2 Hz spike and wave abnormality prominent over the right hemisphere.  Ictal SPECT showed uptake in the left frontal region, it was felt that it was not typical of her seizures."  MRI: MRI with and without contrast in 07/2006 was normal.    PAST MEDICAL HISTORY: Past Medical History:  Diagnosis Date   Central auditory processing disorder    Depression    Seizures (HCC)     MEDICATIONS: Current Outpatient Medications on File Prior to Visit  Medication Sig Dispense Refill   Ascorbic Acid (VITAMIN C) 1000 MG tablet Take 1,000 mg by mouth daily.     cholecalciferol (VITAMIN D3) 25 MCG (1000 UNIT) tablet Take 1,000 Units by mouth daily.     cyanocobalamin (VITAMIN B12) 500 MCG tablet Take 500 mcg by mouth daily.     felbamate (  FELBATOL) 600 MG tablet Take 2 and 1/2 tablets twice daily 150 tablet 5   Magnesium 400 MG CAPS Take by mouth.     Perampanel ([name]) 4 MG TABS Take 1 tablet every night 30 tablet 5   No current facility-administered medications on file prior to visit.    ALLERGIES: Allergies  Allergen Reactions   Dust Mite Extract     FAMILY HISTORY: Family History  Problem Relation Age of Onset   Lung cancer Maternal Grandfather        Died at 13   Cancer Paternal Grandmother        Died at 80    SOCIAL  HISTORY: Social History   Socioeconomic History   Marital status: Single    Spouse name: Not on file   Number of children: Not on file   Years of education: Not on file   Highest education level: Not on file  Occupational History   Not on file  Tobacco Use   Smoking status: Never    Passive exposure: Yes   Smokeless tobacco: Never   Tobacco comments:    Step father smokes   Vaping Use   Vaping status: Never Used  Substance and Sexual Activity   Alcohol use: No    Alcohol/week: 0.0 standard drinks of alcohol   Drug use: No    Comment: THC A   Sexual activity: Not Currently    Birth control/protection: None  Other Topics Concern   Not on file  Social History Narrative   Jazmon is a 27 yo woman who has graduated. She lives with her parents She has 2 brothers. She enjoys sleeping, listening to music and talking to friends. Ivelis is not currently working or in school. Left hand    Social Determinants of Health   Financial Resource Strain: Not on file  Food Insecurity: Not on file  Transportation Needs: Not on file  Physical Activity: Not on file  Stress: Not on file  Social Connections: Not on file  Intimate Partner Violence: Not on file     PHYSICAL EXAM: Vitals:   11/30/22 1516  BP: 121/87  Pulse: (!) 110  SpO2: 99%   General: No acute distress Head:  Normocephalic/atraumatic Skin/Extremities: No rash, no edema, right foot in cast Neurological Exam: alert and awake. No aphasia or dysarthria. Fund of knowledge is appropriate. Attention and concentration are normal.   Cranial nerves: Pupils equal, round. Extraocular movements intact with no nystagmus. Visual fields full.  No facial asymmetry.  Motor: Bulk and tone normal, muscle strength 5/5 throughout with right foot in cast.  Finger to nose testing intact.  Gait uses rolling scooter with right foot in cast.   IMPRESSION: This is a 27 yo LH woman with intractable epilepsy, etiology unknown. She has had repeated  EEGs in the past, with report of right hemisphere sharp waves, as well as bilateral high amplitude spike discharges bifrontally, R>L. Seizure captured in 2010 started in the left parasagittal region. She has been doing well seizure-free since 12/2020, off Depakote since April 2024. Continue [name] 4mg  qhs and Felbatol 600mg  2 and 1/2 tabs BID. We discussed the possibility of reducing Felbatol to 2 tabs BID on her next visit if they wish to try, understanding risks of breakthrough seizure with any medication adjustment. Continue follow-up with PCP for safety labs. She does not drive. Follow-up in 6 months, call for any changes.    Thank you for allowing me to participate in her care.  Please do  not hesitate to call for any questions or concerns.    Patrcia Dolly, M.D.   CC: [facility]

## 2022-11-30 NOTE — Patient Instructions (Signed)
Good to see you. Wishing you well with your foot! Continue all your medications. Follow-up in 6 months, call for any changes.    Seizure Precautions: 1. If medication has been prescribed for you to prevent seizures, take it exactly as directed.  Do not stop taking the medicine without talking to your doctor first, even if you have not had a seizure in a long time.   2. Avoid activities in which a seizure would cause danger to yourself or to others.  Don't operate dangerous machinery, swim alone, or climb in high or dangerous places, such as on ladders, roofs, or girders.  Do not drive unless your doctor says you may.  3. If you have any warning that you may have a seizure, lay down in a safe place where you can't hurt yourself.    4.  No driving for 6 months from last seizure, as per [facility].   Please refer to the following link on the Epilepsy Foundation of America's website for more information: http://www.epilepsyfoundation.org/answerplace/Social/driving/drivingu.cfm   5.  Maintain good sleep hygiene. Avoid alcohol.  6.  Notify your neurology if you are planning pregnancy or if you become pregnant.  7.  Contact your doctor if you have any problems that may be related to the medicine you are taking.  8.  Call 911 and bring the patient back to the ED if:        A.  The seizure lasts longer than 5 minutes.       B.  The patient doesn't awaken shortly after the seizure  C.  The patient has new problems such as difficulty seeing, speaking or moving  D.  The patient was injured during the seizure  E.  The patient has a temperature over 102 F (39C)  F.  The patient vomited and now is having trouble breathing

## 2023-01-21 ENCOUNTER — Encounter: Payer: Self-pay | Admitting: Neurology

## 2023-04-09 ENCOUNTER — Telehealth: Payer: Self-pay | Admitting: Neurology

## 2023-04-09 DIAGNOSIS — G40219 Localization-related (focal) (partial) symptomatic epilepsy and epileptic syndromes with complex partial seizures, intractable, without status epilepticus: Secondary | ICD-10-CM

## 2023-04-11 ENCOUNTER — Telehealth: Payer: Self-pay | Admitting: Pharmacy Technician

## 2023-04-11 ENCOUNTER — Other Ambulatory Visit (HOSPITAL_COMMUNITY): Payer: Self-pay

## 2023-04-11 NOTE — Telephone Encounter (Signed)
Left message with after hours service on 04/09/23. Trying to get her daughter's medication and pharmacy needs prior authorization

## 2023-04-11 NOTE — Telephone Encounter (Signed)
Pharmacy Patient Advocate Encounter   Received notification from CoverMyMeds that prior authorization for FELBATOL is required/requested.   Insurance verification completed.   The patient is insured through [facility] .   Per test claim: PA required; PA submitted to above mentioned insurance via CoverMyMeds Key/confirmation #/EOC [id number] Status is pending

## 2023-04-27 ENCOUNTER — Other Ambulatory Visit: Payer: Self-pay | Admitting: Neurology

## 2023-04-27 DIAGNOSIS — G40219 Localization-related (focal) (partial) symptomatic epilepsy and epileptic syndromes with complex partial seizures, intractable, without status epilepticus: Secondary | ICD-10-CM

## 2023-04-27 MED ORDER — FYCOMPA 4 MG PO TABS
ORAL_TABLET | ORAL | 3 refills | Status: DC
Start: 2023-04-27 — End: 2023-10-28

## 2023-04-27 MED ORDER — FELBAMATE 600 MG PO TABS
ORAL_TABLET | ORAL | 5 refills | Status: DC
Start: 2023-04-27 — End: 2023-10-28

## 2023-04-27 NOTE — Telephone Encounter (Signed)
le\ft message with the after hour service on  04-26-23 at 5:45 pm   Caller states she wants to know if she can get a 90 day supply   Patient is not having any symptoms   Change RX from [facility] to [facility] [address]   Did not leave name of medication

## 2023-04-27 NOTE — Telephone Encounter (Signed)
Sent to Dr Delice Lesch to send in

## 2023-04-28 NOTE — Telephone Encounter (Signed)
Pharmacy Patient Advocate Encounter  Received notification from [facility] that Prior Authorization for Felbamate 600MG  tablets has been APPROVED from 04-11-2023 to 04-10-2024   PA #/Case ID/Reference #: Nila Nephew

## 2023-06-10 ENCOUNTER — Ambulatory Visit: Payer: Medicaid Other | Admitting: Neurology

## 2023-06-22 ENCOUNTER — Ambulatory Visit: Admitting: Neurology

## 2023-06-23 ENCOUNTER — Ambulatory Visit: Payer: Medicaid Other | Admitting: Neurology

## 2023-09-02 ENCOUNTER — Telehealth: Payer: Self-pay

## 2023-09-02 ENCOUNTER — Telehealth: Payer: Self-pay | Admitting: Pharmacy Technician

## 2023-09-02 NOTE — Telephone Encounter (Signed)
 Pharmacy Patient Advocate Encounter   Received notification from Pt Calls Messages that prior authorization for [name]  4MG  is required/requested.   Insurance verification completed.   The patient is insured through [employer] .   Per test claim: PA required; PA submitted to above mentioned insurance via CoverMyMeds Key/confirmation #/EOC [id number] Status is pending

## 2023-09-02 NOTE — Telephone Encounter (Signed)
 PA has been submitted, and telephone encounter has been created. Please see telephone encounter dated 6.27.25.

## 2023-09-02 NOTE — Telephone Encounter (Signed)
 Pt needs a PA on her [name] 

## 2023-09-28 NOTE — Telephone Encounter (Signed)
 Pharmacy Patient Advocate Encounter  Received notification from [facility] that Prior Authorization for [name]  4MG  tablets has been APPROVED from 09-02-2023 to 09-01-2024   PA #/Case ID/Reference #: [id number]

## 2023-10-28 ENCOUNTER — Encounter: Payer: Self-pay | Admitting: Neurology

## 2023-10-28 ENCOUNTER — Ambulatory Visit: Admitting: Neurology

## 2023-10-28 DIAGNOSIS — G40219 Localization-related (focal) (partial) symptomatic epilepsy and epileptic syndromes with complex partial seizures, intractable, without status epilepticus: Secondary | ICD-10-CM | POA: Diagnosis not present

## 2023-10-28 MED ORDER — FELBAMATE 600 MG PO TABS: ORAL_TABLET | ORAL | 3 refills | Status: AC

## 2023-10-28 MED ORDER — PERAMPANEL 4 MG PO TABS
ORAL_TABLET | ORAL | 3 refills | Status: AC
Start: 2023-10-28 — End: ?

## 2023-10-28 NOTE — Patient Instructions (Addendum)
 Good to see you!  Increase Felbamate  600mg  back to 2 and 1/2 tablets twice a day  2. Continue [name]  4mg  every night  3. Continue to monitor symptoms back on original dose, try to take a video if able  4. Follow-up in 6 months, call for any changes   Seizure Precautions: 1. If medication has been prescribed for you to prevent seizures, take it exactly as directed.  Do not stop taking the medicine without talking to your doctor first, even if you have not had a seizure in a long time.   2. Avoid activities in which a seizure would cause danger to yourself or to others.  Don't operate dangerous machinery, swim alone, or climb in high or dangerous places, such as on ladders, roofs, or girders.  Do not drive unless your doctor says you may.  3. If you have any warning that you may have a seizure, lay down in a safe place where you can't hurt yourself.    4.  No driving for 6 months from last seizure, as per [place]  state law.   Please refer to the following link on the Epilepsy Foundation of America's website for more information: http://www.epilepsyfoundation.org/answerplace/Social/driving/drivingu.cfm   5.  Maintain good sleep hygiene. Avoid alcohol.  6.  Notify your neurology if you are planning pregnancy or if you become pregnant.  7.  Contact your doctor if you have any problems that may be related to the medicine you are taking.  8.  Call 911 and bring the patient back to the ED if:        A.  The seizure lasts longer than 5 minutes.       B.  The patient doesn't awaken shortly after the seizure  C.  The patient has new problems such as difficulty seeing, speaking or moving  D.  The patient was injured during the seizure  E.  The patient has a temperature over 102 F (39C)  F.  The patient vomited and now is having trouble breathing

## 2023-10-28 NOTE — Progress Notes (Signed)
 NEUROLOGY FOLLOW UP OFFICE NOTE  Rebecca Chandler [id number] Aug 02, 1995  HISTORY OF PRESENT ILLNESS: I had the pleasure of seeing Rebecca Chandler in follow-up in the neurology clinic on 10/28/2023.  The patient was last seen a year ago for intractable epilepsy. She is again accompanied by her mother who helps supplement the history today.  Records and images were personally reviewed where available.  Since her last visit, Felbamate  was reduced to 600mg  2.5 tabs in AM, 2 tabs in PM. She is also on [name]  4mg  at bedtime. Her mother reports that there was one incident in July where her boyfriend reported she woke up with a funny look. No tongue bite or incontinence. She did not feel fatigued like she did with prior seizures. No missed medication. She denies any other staring/unresponsive episodes, gaps in time, olfactory/gustatory hallucinations, focal numbness/tingling/weakness, myoclonic jerks. No headaches, dizziness, vision changes, no falls. Sleep has been interrupted due to her boyfriend moving around in bed a lot, she sleeps all day. She has started melatonin which helps at night. They live with their parents. Mood is good.    History On Initial Assessment 03/10/2021: This is a 28 year old left-handed woman with a history of intractable epilepsy presenting to establish adult epilepsy care. Records from her pediatric neurologist Dr. Susen were reviewed and will be summarized as follows. Seizures started at 36 months of age with myoclonus associated with widening of her eyes. She was initially on Depakote  then switched to Felbatol , became seizure-free from age 56-4 and was weaned off medication. She then started having nocturnal arousals with staring, grunting, and sucking sounds, opening and closing her eyelids for 5 minutes. Head would be turned to the right. She was having an increase in frequency of these episodes and was evaluated at [facility]. Typical events were not captured, she  was discharged on Tegretol and Keppra. Tegretol was discontinued and Felbatol  restarted. She had another 6-day EMU admission in January 2003 which showed rare interictal right hemisphere sharp waves. In December 2009, she had simple and complex partial seizures monthly, usually associated with left-sided jerking and left-Todd's paresis, lasting 5-8 minutes, usually between midnight and 5am. At this point she was on Felbatol  and Lamictal . In June 2010, she had an EEG showing frequent high amplitude spike discharges bifrontally that occurred in runs of three to four, more prominent over the right than the left hemisphere particularly at Fp2 and F4.  There was one ictal event after medications were stopped where left leg extended upward and outward, her head deviated tonically to the right, she vocalized and had tonic stiffening of her entire body, followed by clonic activity right greater than left. Seizure started in the left parasagittal region, migrated to the left temporal followed by generalized myogenic artifact, followed by rhythmic 2 Hz spike and wave abnormality prominent over the right hemisphere. Ictal SPECT showed uptake in the left frontal region, it was felt that it was not typical of her seizures. She was discharged on Trileptal and Lamictal  but did not tolerate Trileptal and switched back to Felbatol . In 2013, they were reporting more myoclonus. In May 2021, she was started on Vimpat  which made her drowsy, she was switched to [name]  which helped better. Per Dr. Lorrine notes, [name]  has been the most useful medication although she would have occasional drowsiness. Her mother also reported episodes of crying and anger that was blamed on [name] , but these began to subside without any change in medication. Majority of her seizures  have been nocturnal generalized convulsions that would cause her to fall. [name]  was increased to 6mg  in 08/2020.   Her mother reports that majority of her seizures are  nocturnal, between 2-4am. They may be in sleep transitions where she is going to the bathroom, one time her mother found a trail of urine from the hallway to the living room. She has bitten her tongue several times with the seizures. She denies any prior warning/prodrome to the seizures, when she wakes up they feel something always happens on her right side. She denies any myoclonus, olfactory/gustatory hallucinations, focal numbness/tingling/weakness. She denies any headaches. She gets dizzy after taking her night dose of [name] , worse when dose was increased to 6mg . She denies any diplopia, dysarthria/dysphagia, neck/back pain, bowel/bladder dysfunction. Her mother was letting her manage her own medications for the past couple of years, but found out 3 months ago that she was not taking her medications as prescribed when her mother did a pill count. Since then, she has been using a pillbox and alarm, with no seizures in the past 3 months. They would like to go back to 4mg  qhs now that she is compliant. She is also on Felbatol  600mg  2 and 1/2 tabs BID and Depakote  ER 500mg  2 tabs qhs. She lives with her parents and stays mostly at home watching TV and social media. She does not drive. She has Nexplanon for birth control. They report a little depression, she is sleeping a lot, although sleep is interrupted. She will be seeing a therapist.  Epilepsy Risk Factors:  Her paternal grandmother and uncle were diagnosed with seizures. She had a normal birth and early development.  There is no history of febrile convulsions, CNS infections such as meningitis/encephalitis, significant traumatic brain injury, neurosurgical procedures.  Prior ASMs: Tegretol, Trileptal, Keppra, Vimpat , Lamictal , side effects on [name]  6mg  dose, Depakote   Diagnostic Data: EEGs: Per Dr. Lorrine note: [facility] EMU January 2003 reported showed rare interictal right hemisphere sharp waves.   [facility] EMU in June 2010 reported  frequent high amplitude spike discharges bifrontally that occurred in runs of three to four, more prominent over the right than the left hemisphere particularly at Fp2 and F4.  There was one ictal event after medications were stopped where left leg extended upward and outward, her head deviated tonically to the right, she vocalized and had tonic stiffening of her entire body, followed by clonic activity right greater than left. Seizure started in the left parasagittal region, migrated to the left temporal followed by generalized myogenic artifact, followed by rhythmic 2 Hz spike and wave abnormality prominent over the right hemisphere.  Ictal SPECT showed uptake in the left frontal region, it was felt that it was not typical of her seizures.  MRI: MRI with and without contrast in 07/2006 was normal.    PAST MEDICAL HISTORY: Past Medical History:  Diagnosis Date   Central auditory processing disorder    Depression    Seizures (HCC)     MEDICATIONS: Current Outpatient Medications on File Prior to Visit  Medication Sig Dispense Refill   cholecalciferol (VITAMIN D3) 25 MCG (1000 UNIT) tablet Take 1,000 Units by mouth daily.     cyanocobalamin (VITAMIN B12) 500 MCG tablet Take 500 mcg by mouth daily.     felbamate  (FELBATOL ) 600 MG tablet 2 AND 1/2 TABLETS TWICE A DAY 150 tablet 5   Perampanel  ([name] ) 4 MG TABS Take 1 tablet every night 90 tablet 3   Ascorbic Acid (VITAMIN C)  1000 MG tablet Take 1,000 mg by mouth daily. (Patient not taking: Reported on 10/28/2023)     Magnesium 400 MG CAPS Take by mouth. (Patient not taking: Reported on 10/28/2023)     No current facility-administered medications on file prior to visit.    ALLERGIES: Allergies  Allergen Reactions   Dust Mite Extract     FAMILY HISTORY: Family History  Problem Relation Age of Onset   Lung cancer Maternal Grandfather        Died at 47   Cancer Paternal Grandmother        Died at 101    SOCIAL HISTORY: Social  History   Socioeconomic History   Marital status: Single    Spouse name: Not on file   Number of children: Not on file   Years of education: Not on file   Highest education level: Not on file  Occupational History   Not on file  Tobacco Use   Smoking status: Never    Passive exposure: Yes   Smokeless tobacco: Never   Tobacco comments:    Step father smokes   Vaping Use   Vaping status: Never Used  Substance and Sexual Activity   Alcohol use: No    Alcohol/week: 0.0 standard drinks of alcohol   Drug use: No    Comment: THC A   Sexual activity: Not Currently    Birth control/protection: None  Other Topics Concern   Not on file  Social History Narrative   Celestina is a 28 yo woman who has graduated. She lives with her parents She has 2 brothers. She enjoys sleeping, listening to music and talking to friends. Zhuri is not currently working or in school. Left hand    Social Drivers of Corporate investment banker Strain: Not on file  Food Insecurity: Not on file  Transportation Needs: Not on file  Physical Activity: Not on file  Stress: Not on file  Social Connections: Not on file  Intimate Partner Violence: Not on file     PHYSICAL EXAM: Vitals:   10/28/23 1124  BP: 119/79  Pulse: (!) 114  SpO2: 97%   General: No acute distress Head:  Normocephalic/atraumatic Skin/Extremities: No rash, no edema Neurological Exam: alert and awake. No aphasia or dysarthria. Fund of knowledge is appropriate. Attention and concentration are normal.   Cranial nerves: Pupils equal, round. Extraocular movements intact with no nystagmus. Visual fields full.  No facial asymmetry.  Motor: Bulk and tone normal, muscle strength 5/5 throughout with no pronator drift.   Finger to nose testing intact.  Gait narrow-based and steady, able to tandem walk adequately.  Romberg negative.   IMPRESSION: This is a 28 yo LH woman with intractable epilepsy, etiology unknown. She has had repeated EEGs in the  past, with report of right hemisphere sharp waves, as well as bilateral high amplitude spike discharges bifrontally, R>L. Seizure captured in 2010 started in the left parasagittal region. She has not had typical seizures since 12/2020, however her boyfriend reported a funny look in July, no other typical post-ictal symptoms. We discussed increasing Felbamate  back to 1500mg  BID (600mg : 2.5 tabs BID), continue [name]  4mg  at bedtime. Continue to monitor symptoms. She does not drive. We discussed avoidance of seizure triggers, the importance of sleep hygiene was discussed. Follow-up in 6 months, call for any changes.   Thank you for allowing me to participate in her care.  Please do not hesitate to call for any questions or concerns.   Darice Shivers,  M.D.   CC: [facility]

## 2024-01-11 ENCOUNTER — Ambulatory Visit: Admitting: Neurology

## 2024-01-19 ENCOUNTER — Encounter: Payer: Self-pay | Admitting: Neurology

## 2024-05-28 ENCOUNTER — Ambulatory Visit: Admitting: Neurology
